# Patient Record
Sex: Male | Born: 1995 | Race: White | Hispanic: No | Marital: Single | Smoking: Never smoker
Health system: Southern US, Community
[De-identification: ages and names within clinical notes are randomized; demographics above are authoritative.]

## PROBLEM LIST (undated history)

## (undated) DIAGNOSIS — F32A Depression, unspecified: Secondary | ICD-10-CM

## (undated) DIAGNOSIS — N189 Chronic kidney disease, unspecified: Secondary | ICD-10-CM

## (undated) DIAGNOSIS — F419 Anxiety disorder, unspecified: Secondary | ICD-10-CM

## (undated) DIAGNOSIS — E119 Type 2 diabetes mellitus without complications: Secondary | ICD-10-CM

## (undated) DIAGNOSIS — N184 Chronic kidney disease, stage 4 (severe): Secondary | ICD-10-CM

---
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
## 2018-06-14 DIAGNOSIS — E1065 Type 1 diabetes mellitus with hyperglycemia: Secondary | ICD-10-CM | POA: Diagnosis not present

## 2018-07-25 DIAGNOSIS — Z6820 Body mass index (BMI) 20.0-20.9, adult: Secondary | ICD-10-CM | POA: Diagnosis not present

## 2018-07-25 DIAGNOSIS — E109 Type 1 diabetes mellitus without complications: Secondary | ICD-10-CM | POA: Diagnosis not present

## 2018-08-30 DIAGNOSIS — Z6820 Body mass index (BMI) 20.0-20.9, adult: Secondary | ICD-10-CM | POA: Diagnosis not present

## 2018-08-30 DIAGNOSIS — E109 Type 1 diabetes mellitus without complications: Secondary | ICD-10-CM | POA: Diagnosis not present

## 2018-09-09 DIAGNOSIS — E109 Type 1 diabetes mellitus without complications: Secondary | ICD-10-CM | POA: Diagnosis not present

## 2018-10-11 DIAGNOSIS — E109 Type 1 diabetes mellitus without complications: Secondary | ICD-10-CM | POA: Diagnosis not present

## 2018-10-11 DIAGNOSIS — Z6820 Body mass index (BMI) 20.0-20.9, adult: Secondary | ICD-10-CM | POA: Diagnosis not present

## 2018-11-08 DIAGNOSIS — Z6820 Body mass index (BMI) 20.0-20.9, adult: Secondary | ICD-10-CM | POA: Diagnosis not present

## 2018-11-08 DIAGNOSIS — E109 Type 1 diabetes mellitus without complications: Secondary | ICD-10-CM | POA: Diagnosis not present

## 2018-11-16 DIAGNOSIS — E109 Type 1 diabetes mellitus without complications: Secondary | ICD-10-CM | POA: Diagnosis not present

## 2018-12-20 DIAGNOSIS — E109 Type 1 diabetes mellitus without complications: Secondary | ICD-10-CM | POA: Diagnosis not present

## 2019-01-09 DIAGNOSIS — E109 Type 1 diabetes mellitus without complications: Secondary | ICD-10-CM | POA: Diagnosis not present

## 2019-01-09 DIAGNOSIS — Z6820 Body mass index (BMI) 20.0-20.9, adult: Secondary | ICD-10-CM | POA: Diagnosis not present

## 2019-02-20 DIAGNOSIS — E109 Type 1 diabetes mellitus without complications: Secondary | ICD-10-CM | POA: Diagnosis not present

## 2019-02-23 DIAGNOSIS — Z6820 Body mass index (BMI) 20.0-20.9, adult: Secondary | ICD-10-CM | POA: Diagnosis not present

## 2019-02-23 DIAGNOSIS — Z7189 Other specified counseling: Secondary | ICD-10-CM | POA: Diagnosis not present

## 2019-02-23 DIAGNOSIS — E109 Type 1 diabetes mellitus without complications: Secondary | ICD-10-CM | POA: Diagnosis not present

## 2019-02-27 DIAGNOSIS — E109 Type 1 diabetes mellitus without complications: Secondary | ICD-10-CM | POA: Diagnosis not present

## 2019-05-29 DIAGNOSIS — Z6820 Body mass index (BMI) 20.0-20.9, adult: Secondary | ICD-10-CM | POA: Diagnosis not present

## 2019-05-29 DIAGNOSIS — Z7189 Other specified counseling: Secondary | ICD-10-CM | POA: Diagnosis not present

## 2019-05-29 DIAGNOSIS — E109 Type 1 diabetes mellitus without complications: Secondary | ICD-10-CM | POA: Diagnosis not present

## 2019-05-29 DIAGNOSIS — R748 Abnormal levels of other serum enzymes: Secondary | ICD-10-CM | POA: Diagnosis not present

## 2019-08-28 DIAGNOSIS — Z7189 Other specified counseling: Secondary | ICD-10-CM | POA: Diagnosis not present

## 2019-08-28 DIAGNOSIS — R748 Abnormal levels of other serum enzymes: Secondary | ICD-10-CM | POA: Diagnosis not present

## 2019-08-28 DIAGNOSIS — E109 Type 1 diabetes mellitus without complications: Secondary | ICD-10-CM | POA: Diagnosis not present

## 2019-08-31 DIAGNOSIS — E109 Type 1 diabetes mellitus without complications: Secondary | ICD-10-CM | POA: Diagnosis not present

## 2019-11-27 DIAGNOSIS — E109 Type 1 diabetes mellitus without complications: Secondary | ICD-10-CM | POA: Diagnosis not present

## 2019-11-30 DIAGNOSIS — Z7189 Other specified counseling: Secondary | ICD-10-CM | POA: Diagnosis not present

## 2019-11-30 DIAGNOSIS — E109 Type 1 diabetes mellitus without complications: Secondary | ICD-10-CM | POA: Diagnosis not present

## 2019-11-30 DIAGNOSIS — R748 Abnormal levels of other serum enzymes: Secondary | ICD-10-CM | POA: Diagnosis not present

## 2019-12-19 DIAGNOSIS — Z23 Encounter for immunization: Secondary | ICD-10-CM | POA: Diagnosis not present

## 2020-01-16 DIAGNOSIS — Z23 Encounter for immunization: Secondary | ICD-10-CM | POA: Diagnosis not present

## 2020-01-31 DIAGNOSIS — E109 Type 1 diabetes mellitus without complications: Secondary | ICD-10-CM | POA: Diagnosis not present

## 2020-05-27 DIAGNOSIS — E109 Type 1 diabetes mellitus without complications: Secondary | ICD-10-CM | POA: Diagnosis not present

## 2020-06-03 DIAGNOSIS — E109 Type 1 diabetes mellitus without complications: Secondary | ICD-10-CM | POA: Diagnosis not present

## 2020-06-10 DIAGNOSIS — E109 Type 1 diabetes mellitus without complications: Secondary | ICD-10-CM | POA: Diagnosis not present

## 2020-06-15 DIAGNOSIS — E109 Type 1 diabetes mellitus without complications: Secondary | ICD-10-CM | POA: Diagnosis not present

## 2020-07-01 DIAGNOSIS — E161 Other hypoglycemia: Secondary | ICD-10-CM | POA: Diagnosis not present

## 2020-07-01 DIAGNOSIS — R52 Pain, unspecified: Secondary | ICD-10-CM | POA: Diagnosis not present

## 2020-07-01 DIAGNOSIS — R569 Unspecified convulsions: Secondary | ICD-10-CM | POA: Diagnosis not present

## 2020-07-01 DIAGNOSIS — E162 Hypoglycemia, unspecified: Secondary | ICD-10-CM | POA: Diagnosis not present

## 2020-08-22 DIAGNOSIS — E109 Type 1 diabetes mellitus without complications: Secondary | ICD-10-CM | POA: Diagnosis not present

## 2020-11-27 DIAGNOSIS — E109 Type 1 diabetes mellitus without complications: Secondary | ICD-10-CM | POA: Diagnosis not present

## 2020-12-02 DIAGNOSIS — E1069 Type 1 diabetes mellitus with other specified complication: Secondary | ICD-10-CM | POA: Diagnosis not present

## 2020-12-02 DIAGNOSIS — E559 Vitamin D deficiency, unspecified: Secondary | ICD-10-CM | POA: Diagnosis not present

## 2020-12-02 DIAGNOSIS — R809 Proteinuria, unspecified: Secondary | ICD-10-CM | POA: Diagnosis not present

## 2020-12-02 DIAGNOSIS — E109 Type 1 diabetes mellitus without complications: Secondary | ICD-10-CM | POA: Diagnosis not present

## 2020-12-03 DIAGNOSIS — E109 Type 1 diabetes mellitus without complications: Secondary | ICD-10-CM | POA: Diagnosis not present

## 2021-03-04 DIAGNOSIS — E1069 Type 1 diabetes mellitus with other specified complication: Secondary | ICD-10-CM | POA: Diagnosis not present

## 2021-03-11 DIAGNOSIS — E109 Type 1 diabetes mellitus without complications: Secondary | ICD-10-CM | POA: Diagnosis not present

## 2021-03-11 DIAGNOSIS — E1069 Type 1 diabetes mellitus with other specified complication: Secondary | ICD-10-CM | POA: Diagnosis not present

## 2021-03-11 DIAGNOSIS — E559 Vitamin D deficiency, unspecified: Secondary | ICD-10-CM | POA: Diagnosis not present

## 2021-03-11 DIAGNOSIS — R809 Proteinuria, unspecified: Secondary | ICD-10-CM | POA: Diagnosis not present

## 2021-03-24 DIAGNOSIS — E109 Type 1 diabetes mellitus without complications: Secondary | ICD-10-CM | POA: Diagnosis not present

## 2021-04-01 DIAGNOSIS — R809 Proteinuria, unspecified: Secondary | ICD-10-CM | POA: Diagnosis not present

## 2021-04-01 DIAGNOSIS — N2581 Secondary hyperparathyroidism of renal origin: Secondary | ICD-10-CM | POA: Diagnosis not present

## 2021-04-01 DIAGNOSIS — D631 Anemia in chronic kidney disease: Secondary | ICD-10-CM | POA: Diagnosis not present

## 2021-04-01 DIAGNOSIS — N181 Chronic kidney disease, stage 1: Secondary | ICD-10-CM | POA: Diagnosis not present

## 2021-04-02 ENCOUNTER — Other Ambulatory Visit: Payer: Self-pay | Admitting: Nephrology

## 2021-04-02 DIAGNOSIS — N181 Chronic kidney disease, stage 1: Secondary | ICD-10-CM

## 2021-04-15 ENCOUNTER — Ambulatory Visit
Admission: RE | Admit: 2021-04-15 | Discharge: 2021-04-15 | Disposition: A | Discharge status: Home / Self Care | Payer: Self-pay | Source: Ambulatory Visit | Attending: Nephrology | Admitting: Nephrology

## 2021-04-15 DIAGNOSIS — N181 Chronic kidney disease, stage 1: Secondary | ICD-10-CM

## 2021-04-15 DIAGNOSIS — R809 Proteinuria, unspecified: Secondary | ICD-10-CM | POA: Diagnosis not present

## 2021-06-03 DIAGNOSIS — R809 Proteinuria, unspecified: Secondary | ICD-10-CM | POA: Diagnosis not present

## 2021-06-03 DIAGNOSIS — N181 Chronic kidney disease, stage 1: Secondary | ICD-10-CM | POA: Diagnosis not present

## 2021-06-10 DIAGNOSIS — E1069 Type 1 diabetes mellitus with other specified complication: Secondary | ICD-10-CM | POA: Diagnosis not present

## 2021-06-10 DIAGNOSIS — E559 Vitamin D deficiency, unspecified: Secondary | ICD-10-CM | POA: Diagnosis not present

## 2021-06-10 DIAGNOSIS — E109 Type 1 diabetes mellitus without complications: Secondary | ICD-10-CM | POA: Diagnosis not present

## 2021-06-10 DIAGNOSIS — R809 Proteinuria, unspecified: Secondary | ICD-10-CM | POA: Diagnosis not present

## 2021-06-19 DIAGNOSIS — E109 Type 1 diabetes mellitus without complications: Secondary | ICD-10-CM | POA: Diagnosis not present

## 2021-10-07 DIAGNOSIS — E109 Type 1 diabetes mellitus without complications: Secondary | ICD-10-CM | POA: Diagnosis not present

## 2021-12-01 DIAGNOSIS — N181 Chronic kidney disease, stage 1: Secondary | ICD-10-CM | POA: Diagnosis not present

## 2021-12-08 DIAGNOSIS — N181 Chronic kidney disease, stage 1: Secondary | ICD-10-CM | POA: Diagnosis not present

## 2021-12-08 DIAGNOSIS — N2581 Secondary hyperparathyroidism of renal origin: Secondary | ICD-10-CM | POA: Diagnosis not present

## 2021-12-08 DIAGNOSIS — R809 Proteinuria, unspecified: Secondary | ICD-10-CM | POA: Diagnosis not present

## 2021-12-08 DIAGNOSIS — D631 Anemia in chronic kidney disease: Secondary | ICD-10-CM | POA: Diagnosis not present

## 2021-12-09 DIAGNOSIS — E1069 Type 1 diabetes mellitus with other specified complication: Secondary | ICD-10-CM | POA: Diagnosis not present

## 2021-12-09 DIAGNOSIS — E559 Vitamin D deficiency, unspecified: Secondary | ICD-10-CM | POA: Diagnosis not present

## 2021-12-16 DIAGNOSIS — E559 Vitamin D deficiency, unspecified: Secondary | ICD-10-CM | POA: Diagnosis not present

## 2021-12-16 DIAGNOSIS — R809 Proteinuria, unspecified: Secondary | ICD-10-CM | POA: Diagnosis not present

## 2021-12-16 DIAGNOSIS — E109 Type 1 diabetes mellitus without complications: Secondary | ICD-10-CM | POA: Diagnosis not present

## 2021-12-16 DIAGNOSIS — E1069 Type 1 diabetes mellitus with other specified complication: Secondary | ICD-10-CM | POA: Diagnosis not present

## 2022-02-06 DIAGNOSIS — E109 Type 1 diabetes mellitus without complications: Secondary | ICD-10-CM | POA: Diagnosis not present

## 2022-03-19 ENCOUNTER — Other Ambulatory Visit (HOSPITAL_COMMUNITY): Payer: Self-pay | Admitting: Nephrology

## 2022-03-19 ENCOUNTER — Other Ambulatory Visit: Payer: Self-pay | Admitting: Nephrology

## 2022-03-19 DIAGNOSIS — R809 Proteinuria, unspecified: Secondary | ICD-10-CM

## 2022-05-06 ENCOUNTER — Other Ambulatory Visit (HOSPITAL_COMMUNITY): Payer: Self-pay | Admitting: Physician Assistant

## 2022-05-06 ENCOUNTER — Other Ambulatory Visit: Payer: Self-pay | Admitting: Radiology

## 2022-05-06 DIAGNOSIS — Z01812 Encounter for preprocedural laboratory examination: Secondary | ICD-10-CM

## 2022-05-06 NOTE — H&P (Signed)
Chief Complaint: Patient was seen in consultation today for proteinuria  Referring Physician(s): Singh,Vikas  Supervising Physician: Gilmer Mor  Patient Status: [facility] - Out-pt  History of Present Illness: Stephen Prince is a 26 y.o. 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 He presents today in his usual state of health. He has been NPO.  He has turned off his insulin pump for hte procedure.  Denies new complaints or concerns. He is understanding of the goals of th eprocedure and is agreeable to proceed.   Past Medical History:  Diagnosis Date   Chronic kidney disease    Diabetes mellitus without complication (HCC)     Allergies: Patient has no known allergies.  Medications: Prior to Admission medications   Medication Sig Start Date End Date Taking? Authorizing Provider  insulin aspart (NOVOLOG) 100 UNIT/ML injection Inject into the skin continuous. Uses in insulin pump   Yes [provider]  losartan (COZAAR) 25 MG tablet Take 12.5 mg by mouth at bedtime.   Yes [provider]  rosuvastatin (CRESTOR) 10 MG tablet Take 10 mg by mouth daily.   Yes [provider]  sertraline (ZOLOFT) 50 MG tablet Take 50 mg by mouth daily.   Yes [provider]  Vitamin D, Ergocalciferol, (DRISDOL) 1.25 MG (50000 UNIT) CAPS capsule Take 50,000 Units by mouth every Wednesday.   Yes [provider]     History reviewed. No pertinent family history.  Social History   Socioeconomic History   Marital status: Single    Spouse name: Not on file   Number of children: Not on file   Years of education: Not on file   Highest education level: Not on file  Occupational History   Not on file  Tobacco Use   Smoking status: Never   Smokeless tobacco: Never  Vaping Use   Vaping Use: Never used  Substance and Sexual Activity   Alcohol use: Never    Drug use: Never   Sexual activity: Not on file  Other Topics Concern   Not on file  Social History Narrative   Not on file   Social Determinants of Health   Financial Resource Strain: Not on file  Food Insecurity: Not on file  Transportation Needs: Not on file  Physical Activity: Not on file  Stress: Not on file  Social Connections: Not on file     Review of Systems: A 12 point ROS discussed and pertinent positives are indicated in the HPI above.  All other systems are negative.  Review of Systems  Constitutional:  Negative for fatigue and fever.  Respiratory:  Negative for cough and shortness of breath.   Cardiovascular:  Negative for chest pain.  Gastrointestinal:  Negative for abdominal pain.  Musculoskeletal:  Negative for back pain.  Psychiatric/Behavioral:  Negative for behavioral problems and confusion.     Vital Signs: BP (!) 142/96   Pulse 100   Temp 98.5 F (36.9 C) (Oral)   Resp 16   Ht 5\' 7"  (1.702 m)   Wt 140 lb (63.5 kg)   SpO2 100%   BMI 21.93 kg/m   Physical Exam Vitals and nursing note reviewed.  Constitutional:      General: He is not in acute distress.    Appearance: Normal appearance. He is not ill-appearing.  HENT:     Mouth/Throat:     Mouth: Mucous membranes are moist.  Pharynx: Oropharynx is clear.  Cardiovascular:     Rate and Rhythm: Normal rate and regular rhythm.  Pulmonary:     Effort: Pulmonary effort is normal. No respiratory distress.     Breath sounds: Normal breath sounds.  Abdominal:     General: Abdomen is flat.     Palpations: Abdomen is soft.  Neurological:     General: No focal deficit present.     Mental Status: He is alert and oriented to person, place, and time. Mental status is at baseline.  Psychiatric:        Mood and Affect: Mood normal.        Behavior: Behavior normal.      MD Evaluation Airway: WNL Heart: WNL Abdomen: WNL Chest/ Lungs: WNL ASA  Classification: 3 Mallampati/Airway Score:  Two   Imaging: No results found.  Labs:  CBC: Recent Labs    05/07/22 0630  WBC 9.7  HGB 13.7  HCT 41.2  PLT 342    COAGS: Recent Labs    05/07/22 0630  INR 1.1    BMP: No results for input(s): "NA", "K", "CL", "CO2", "GLUCOSE", "BUN", "CALCIUM", "CREATININE", "GFRNONAA", "GFRAA" in the last 8760 hours.  Invalid input(s): "CMP"  LIVER FUNCTION TESTS: No results for input(s): "BILITOT", "AST", "ALT", "ALKPHOS", "PROT", "ALBUMIN" in the last 8760 hours.  TUMOR MARKERS: No results for input(s): "AFPTM", "CEA", "CA199", "CHROMGRNA" in the last 8760 hours.  Assessment and Plan: Patient with past medical history of DM1 presents with complaint of proteinuria.  IR consulted for random renal biospy at the request of Dr. Thedore Mins. Case reviewed by Dr. Jenkins Rouge who approves patient for procedure.  Patient presents today in their usual state of health.  He has been NPO and is not currently on blood thinners.   Risks and benefits was discussed with the patient and/or patient's family including, but not limited to bleeding, infection, damage to adjacent structures or low yield requiring additional tests.  All of the questions were answered and there is agreement to proceed.  Consent signed and in chart.   Thank you for this interesting consult.  I greatly enjoyed meeting ZADE FALKNER and look forward to participating in their care.  A copy of this report was sent to the requesting provider on this date.  Electronically Signed: Hoyt Koch, PA 05/07/2022, 7:58 AM   I spent a total of  30 Minutes   in face to face in clinical consultation, greater than 50% of which was counseling/coordinating care for proteinuria.

## 2022-05-07 ENCOUNTER — Encounter (HOSPITAL_COMMUNITY): Payer: Self-pay

## 2022-05-07 ENCOUNTER — Ambulatory Visit (HOSPITAL_COMMUNITY)
Admission: RE | Admit: 2022-05-07 | Discharge: 2022-05-07 | Disposition: A | Discharge status: Home / Self Care | Payer: 59 | Source: Ambulatory Visit | Attending: Nephrology | Admitting: Nephrology

## 2022-05-07 DIAGNOSIS — E1022 Type 1 diabetes mellitus with diabetic chronic kidney disease: Secondary | ICD-10-CM | POA: Insufficient documentation

## 2022-05-07 DIAGNOSIS — N189 Chronic kidney disease, unspecified: Secondary | ICD-10-CM | POA: Diagnosis not present

## 2022-05-07 DIAGNOSIS — E1069 Type 1 diabetes mellitus with other specified complication: Secondary | ICD-10-CM | POA: Diagnosis not present

## 2022-05-07 DIAGNOSIS — Z01812 Encounter for preprocedural laboratory examination: Secondary | ICD-10-CM

## 2022-05-07 DIAGNOSIS — R809 Proteinuria, unspecified: Secondary | ICD-10-CM | POA: Insufficient documentation

## 2022-05-07 HISTORY — DX: Type 2 diabetes mellitus without complications: E11.9

## 2022-05-07 HISTORY — DX: Chronic kidney disease, unspecified: N18.9

## 2022-05-07 LAB — CBC
HCT: 41.2 % (ref 39.0–52.0)
Hemoglobin: 13.7 g/dL (ref 13.0–17.0)
MCH: 28.4 pg (ref 26.0–34.0)
MCHC: 33.3 g/dL (ref 30.0–36.0)
MCV: 85.5 fL (ref 80.0–100.0)
Platelets: 342 10*3/uL (ref 150–400)
RBC: 4.82 MIL/uL (ref 4.22–5.81)
RDW: 12.2 % (ref 11.5–15.5)
WBC: 9.7 10*3/uL (ref 4.0–10.5)
nRBC: 0 % (ref 0.0–0.2)

## 2022-05-07 LAB — GLUCOSE, CAPILLARY: Glucose-Capillary: 85 mg/dL (ref 70–99)

## 2022-05-07 LAB — PROTIME-INR
INR: 1.1 (ref 0.8–1.2)
Prothrombin Time: 14.2 seconds (ref 11.4–15.2)

## 2022-05-07 MED ORDER — LIDOCAINE HCL (PF) 1 % IJ SOLN
INTRAMUSCULAR | Status: AC
  Filled 2022-05-07: qty 30

## 2022-05-07 MED ORDER — SODIUM CHLORIDE 0.9 % IV SOLN: INTRAVENOUS | Status: DC

## 2022-05-07 MED ORDER — FENTANYL CITRATE (PF) 100 MCG/2ML IJ SOLN
INTRAMUSCULAR | Status: AC | PRN
  Administered 2022-05-07: 25 ug via INTRAVENOUS
  Administered 2022-05-07: 50 ug via INTRAVENOUS

## 2022-05-07 MED ORDER — HYDRALAZINE HCL 20 MG/ML IJ SOLN
INTRAMUSCULAR | Status: AC
  Filled 2022-05-07: qty 1

## 2022-05-07 MED ORDER — MIDAZOLAM HCL 2 MG/2ML IJ SOLN
INTRAMUSCULAR | Status: AC | PRN
  Administered 2022-05-07: .5 mg via INTRAVENOUS
  Administered 2022-05-07: 1 mg via INTRAVENOUS

## 2022-05-07 MED ORDER — MIDAZOLAM HCL 2 MG/2ML IJ SOLN
INTRAMUSCULAR | Status: AC
  Filled 2022-05-07: qty 2

## 2022-05-07 MED ORDER — GELATIN ABSORBABLE 12-7 MM EX MISC
CUTANEOUS | Status: AC
  Filled 2022-05-07: qty 1

## 2022-05-07 MED ORDER — FENTANYL CITRATE (PF) 100 MCG/2ML IJ SOLN
INTRAMUSCULAR | Status: AC
  Filled 2022-05-07: qty 2

## 2022-05-07 MED ORDER — HYDRALAZINE HCL 20 MG/ML IJ SOLN
10.0000 mg | Freq: Once | INTRAMUSCULAR | Status: AC
Start: 2022-05-07 — End: 2022-05-07
  Administered 2022-05-07: 10 mg via INTRAVENOUS

## 2022-05-07 NOTE — Procedures (Signed)
Interventional Radiology Procedure Note  Procedure: US guided random renal biopsy, LEFT kidney  Complications: None  Estimated Blood Loss: None  Recommendations: - Bedrest x 4 hrs - Path sent   Signed,  Sterling Big, MD

## 2022-05-11 ENCOUNTER — Encounter (HOSPITAL_COMMUNITY): Payer: Self-pay

## 2022-05-12 LAB — SURGICAL PATHOLOGY

## 2022-10-08 ENCOUNTER — Ambulatory Visit: Payer: 59 | Admitting: Podiatry

## 2022-10-08 DIAGNOSIS — E1042 Type 1 diabetes mellitus with diabetic polyneuropathy: Secondary | ICD-10-CM | POA: Diagnosis not present

## 2022-10-08 DIAGNOSIS — L84 Corns and callosities: Secondary | ICD-10-CM | POA: Diagnosis not present

## 2022-10-08 NOTE — Progress Notes (Signed)
  Subjective:  Patient ID: Stephen Prince, male    DOB: Jun 25, 1996,  MRN: [id number]  Chief Complaint  Patient presents with   Diabetes    DFC/Evaluation  BS-177 A1C-6.8     27 y.o. male presents with the above complaint. History confirmed with patient. Patient presenting for diabetic foot evaluation and due to concern for calluses. Patient is unable to trim own nails related to nail dystrophy and/or mobility issues. Patient has a history of type 1 diabetes mellitus on insulin patient doest have callus present located at the distal tuft of the left hallux, right hallux interphalangeal joint medially causing pain.   Objective:  Physical Exam: warm, good capillary refill nail exam normal nails without lesions DP pulses palpable, PT pulses palpable, and protective sensation absent Left Foot: Hyperkeratotic lesion present medial aspect of the hallux IPJ Right Foot: Hyperkeratotic lesion mild skin fissuring at the distal aspect of the left hallux plantarly  Assessment:   1. Pre-ulcerative calluses   2. Type 1 diabetes mellitus with diabetic polyneuropathy ([place])      Plan:  Patient was evaluated and treated and all questions answered.  # Diabetes mellitus type 1 with early onset peripheral neuropathy Patient educated on diabetes. Discussed proper diabetic foot care and discussed risks and complications of disease. Educated patient in depth on reasons to return to the office immediately should he/she discover anything concerning or new on the feet. All questions answered. Discussed proper shoes as well.   #Hyperkeratotic lesions/pre ulcerative calluses present right hallux medial IPJ and left hallux plantar aspect All symptomatic hyperkeratoses x 2 separate lesions were safely debrided with a sterile #10 blade to patient's level of comfort without incident. We discussed preventative and palliative care of these lesions including supportive and accommodative shoegear, padding, prefabricated  and custom molded accommodative orthoses, use of a pumice stone and lotions/creams daily.  Return in about 6 months (around 04/08/2023) for Routine DM exam, calluses.         Everitt Amber, DPM [facility]

## 2023-02-26 DIAGNOSIS — I10 Essential (primary) hypertension: Secondary | ICD-10-CM | POA: Diagnosis present

## 2023-02-26 DIAGNOSIS — F32A Depression, unspecified: Secondary | ICD-10-CM | POA: Diagnosis present

## 2023-04-08 ENCOUNTER — Ambulatory Visit (INDEPENDENT_AMBULATORY_CARE_PROVIDER_SITE_OTHER): Payer: 59 | Admitting: Podiatry

## 2023-04-08 DIAGNOSIS — L84 Corns and callosities: Secondary | ICD-10-CM

## 2023-04-08 DIAGNOSIS — B351 Tinea unguium: Secondary | ICD-10-CM

## 2023-04-08 DIAGNOSIS — M79676 Pain in unspecified toe(s): Secondary | ICD-10-CM | POA: Diagnosis not present

## 2023-04-08 DIAGNOSIS — E1042 Type 1 diabetes mellitus with diabetic polyneuropathy: Secondary | ICD-10-CM

## 2023-04-08 NOTE — Progress Notes (Signed)
  Subjective:  Patient ID: Stephen Prince, male    DOB: September 07, 1996,  MRN: [id number]  Chief Complaint  Patient presents with   Nail Problem    Diabetic Foot Care-nail trim    Callouses    Callus trim     27 y.o. male presents with the above complaint. History confirmed with patient. Patient presenting for diabetic foot evaluation and due to concern for calluses. Patient is unable to trim own nails related to nail dystrophy and/or mobility issues. Patient has a history of type 1 diabetes mellitus on insulin patient doest have callus present located at the distal tuft of the left hallux, right hallux interphalangeal joint medially causing pain.   Objective:  Physical Exam: warm, good capillary refill nail exam normal nails without lesions DP pulses palpable, PT pulses palpable, and protective sensation absent Left Foot: Hyperkeratotic lesion present medial aspect of the hallux IPJ Right Foot: Hyperkeratotic lesion mild skin fissuring at the distal aspect of the left hallux plantarly  Assessment:   1. Pre-ulcerative calluses   2. Type 1 diabetes mellitus with diabetic polyneuropathy (HCC)       Plan:  Patient was evaluated and treated and all questions answered.   #Hyperkeratotic lesions/pre ulcerative calluses present right hallux medial IPJ and left hallux plantar aspect All symptomatic hyperkeratoses x 2 separate lesions were safely debrided with a sterile #10 blade to patient's level of comfort without incident. We discussed preventative and palliative care of these lesions including supportive and accommodative shoegear, padding, prefabricated and custom molded accommodative orthoses, use of a pumice stone and lotions/creams daily.  Return in about 3 months (around 07/09/2023).         Corinna Gab, DPM [facility]

## 2023-05-11 ENCOUNTER — Emergency Department (HOSPITAL_COMMUNITY): Payer: 59

## 2023-05-11 ENCOUNTER — Inpatient Hospital Stay (HOSPITAL_COMMUNITY)
Admission: EM | Admit: 2023-05-11 | Discharge: 2023-05-14 | DRG: 638 | Disposition: A | Discharge status: Home / Self Care | Payer: 59 | Attending: Internal Medicine | Admitting: Internal Medicine

## 2023-05-11 ENCOUNTER — Encounter (HOSPITAL_COMMUNITY): Payer: Self-pay

## 2023-05-11 ENCOUNTER — Other Ambulatory Visit: Payer: Self-pay

## 2023-05-11 DIAGNOSIS — E1022 Type 1 diabetes mellitus with diabetic chronic kidney disease: Secondary | ICD-10-CM | POA: Diagnosis present

## 2023-05-11 DIAGNOSIS — R569 Unspecified convulsions: Secondary | ICD-10-CM | POA: Diagnosis not present

## 2023-05-11 DIAGNOSIS — E860 Dehydration: Secondary | ICD-10-CM | POA: Diagnosis present

## 2023-05-11 DIAGNOSIS — N184 Chronic kidney disease, stage 4 (severe): Secondary | ICD-10-CM | POA: Diagnosis present

## 2023-05-11 DIAGNOSIS — E872 Acidosis, unspecified: Secondary | ICD-10-CM | POA: Diagnosis present

## 2023-05-11 DIAGNOSIS — E785 Hyperlipidemia, unspecified: Secondary | ICD-10-CM | POA: Diagnosis present

## 2023-05-11 DIAGNOSIS — E10649 Type 1 diabetes mellitus with hypoglycemia without coma: Secondary | ICD-10-CM | POA: Diagnosis not present

## 2023-05-11 DIAGNOSIS — N179 Acute kidney failure, unspecified: Secondary | ICD-10-CM | POA: Diagnosis present

## 2023-05-11 DIAGNOSIS — Z794 Long term (current) use of insulin: Secondary | ICD-10-CM

## 2023-05-11 DIAGNOSIS — E871 Hypo-osmolality and hyponatremia: Secondary | ICD-10-CM | POA: Diagnosis present

## 2023-05-11 DIAGNOSIS — E1065 Type 1 diabetes mellitus with hyperglycemia: Secondary | ICD-10-CM | POA: Diagnosis not present

## 2023-05-11 DIAGNOSIS — Z9641 Presence of insulin pump (external) (internal): Secondary | ICD-10-CM | POA: Diagnosis present

## 2023-05-11 DIAGNOSIS — Z79899 Other long term (current) drug therapy: Secondary | ICD-10-CM

## 2023-05-11 DIAGNOSIS — E11649 Type 2 diabetes mellitus with hypoglycemia without coma: Secondary | ICD-10-CM | POA: Diagnosis present

## 2023-05-11 DIAGNOSIS — E8881 Metabolic syndrome: Secondary | ICD-10-CM | POA: Diagnosis present

## 2023-05-11 LAB — CBC WITH DIFFERENTIAL/PLATELET
Abs Immature Granulocytes: 0.07 10*3/uL (ref 0.00–0.07)
Basophils Absolute: 0 10*3/uL (ref 0.0–0.1)
Basophils Relative: 0 %
Eosinophils Absolute: 0.1 10*3/uL (ref 0.0–0.5)
Eosinophils Relative: 1 %
HCT: 34.6 % — ABNORMAL LOW (ref 39.0–52.0)
Hemoglobin: 11.3 g/dL — ABNORMAL LOW (ref 13.0–17.0)
Immature Granulocytes: 1 %
Lymphocytes Relative: 12 %
Lymphs Abs: 1.3 10*3/uL (ref 0.7–4.0)
MCH: 27 pg (ref 26.0–34.0)
MCHC: 32.7 g/dL (ref 30.0–36.0)
MCV: 82.8 fL (ref 80.0–100.0)
Monocytes Absolute: 0.8 10*3/uL (ref 0.1–1.0)
Monocytes Relative: 8 %
Neutro Abs: 8.5 10*3/uL — ABNORMAL HIGH (ref 1.7–7.7)
Neutrophils Relative %: 78 %
Platelets: 293 10*3/uL (ref 150–400)
RBC: 4.18 MIL/uL — ABNORMAL LOW (ref 4.22–5.81)
RDW: 12.9 % (ref 11.5–15.5)
WBC: 10.8 10*3/uL — ABNORMAL HIGH (ref 4.0–10.5)
nRBC: 0 % (ref 0.0–0.2)

## 2023-05-11 LAB — COMPREHENSIVE METABOLIC PANEL
ALT: 19 U/L (ref 0–44)
AST: 25 U/L (ref 15–41)
Albumin: 2.5 g/dL — ABNORMAL LOW (ref 3.5–5.0)
Alkaline Phosphatase: 65 U/L (ref 38–126)
Anion gap: 10 (ref 5–15)
BUN: 38 mg/dL — ABNORMAL HIGH (ref 6–20)
CO2: 20 mmol/L — ABNORMAL LOW (ref 22–32)
Calcium: 8.2 mg/dL — ABNORMAL LOW (ref 8.9–10.3)
Chloride: 104 mmol/L (ref 98–111)
Creatinine, Ser: 3.38 mg/dL — ABNORMAL HIGH (ref 0.61–1.24)
GFR, Estimated: 25 mL/min — ABNORMAL LOW (ref >60)
Glucose, Bld: 190 mg/dL — ABNORMAL HIGH (ref 70–99)
Potassium: 4.3 mmol/L (ref 3.5–5.1)
Sodium: 134 mmol/L — ABNORMAL LOW (ref 135–145)
Total Bilirubin: 0.4 mg/dL (ref 0.3–1.2)
Total Protein: 5.5 g/dL — ABNORMAL LOW (ref 6.5–8.1)

## 2023-05-11 LAB — I-STAT CG4 LACTIC ACID, ED: Lactic Acid, Venous: 1.6 mmol/L (ref 0.5–1.9)

## 2023-05-11 LAB — CBG MONITORING, ED: Glucose-Capillary: 175 mg/dL — ABNORMAL HIGH (ref 70–99)

## 2023-05-11 LAB — ETHANOL: Alcohol, Ethyl (B): <10 mg/dL (ref <10)

## 2023-05-11 MED ORDER — SODIUM CHLORIDE 0.9 % IV BOLUS
1000.0000 mL | Freq: Once | INTRAVENOUS | Status: AC
  Administered 2023-05-11: 1000 mL via INTRAVENOUS
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
## 2023-05-11 NOTE — ED Notes (Signed)
Patient transported to CT 

## 2023-05-11 NOTE — ED Provider Notes (Signed)
[place] EMERGENCY DEPARTMENT AT [facility] Provider Note  CSN: [id number] Arrival date & time: 05/11/23 2011  Chief Complaint(s) Seizures  HPI Stephen Prince is a 27 y.o. male history of type 1 diabetes presenting to the emergency department with possible seizure.  Patient's family reports that the patient came home from work, went to a different room and then they heard a crash, went in and saw patient looking stiff and then developed generalized shaking activity.  Patient stopped, on EMS arrival, patient was combative and was given Versed.  Apparently his insulin pump/glucose monitor had said his blood sugar was 40 and actually gave him another dose of insulin.  When paramedics came patient's blood sugar on their check was 110 but gave additional D10.  Family report that he has had 2 previous episodes, both also in the setting of apparent pump malfunction, they have discussed this with his endocrinologist apparently was aware.  He has not sought care for either prior episode.   Past Medical History Past Medical History:  Diagnosis Date   Chronic kidney disease    Diabetes mellitus without complication (HCC)    There are no problems to display for this patient.  Home Medication(s) Prior to Admission medications   Medication Sig Start Date End Date Taking? Authorizing Provider  doxycycline (VIBRAMYCIN) 100 MG capsule Take 100 mg by mouth 2 (two) times daily. 03/31/23  Yes [provider]  insulin aspart (NOVOLOG) 100 UNIT/ML injection Inject into the skin continuous. Uses in insulin pump   Yes [provider]  losartan (COZAAR) 100 MG tablet Take 50 mg by mouth daily.   Yes [provider]  rosuvastatin (CRESTOR) 20 MG tablet Take 20 mg by mouth at bedtime.   Yes [provider]  sertraline (ZOLOFT) 100 MG tablet Take 100 mg by mouth at bedtime.   Yes [provider]  Vitamin D, Ergocalciferol, (DRISDOL) 1.25 MG (50000 UNIT) CAPS  capsule Take 50,000 Units by mouth every 7 (seven) days.   Yes [provider]                                                                                                                                    Past Surgical History History reviewed. No pertinent surgical history. Family History History reviewed. No pertinent family history.  Social History Social History   Tobacco Use   Smoking status: Never   Smokeless tobacco: Never  Vaping Use   Vaping status: Never Used  Substance Use Topics   Alcohol use: Never   Drug use: Never   Allergies Patient has no known allergies.  Review of Systems Review of Systems  Unable to perform ROS: Mental status change    Physical Exam Vital Signs  I have reviewed the triage vital signs BP (!) 142/97   Pulse 95   Temp (!) 96.8 F (36 C) (Axillary)   Resp (!) 24  SpO2 97%  Physical Exam Vitals and nursing note reviewed.  Constitutional:      General: He is not in acute distress.    Comments: Somnolent, arouses to sternal rub but falls back asleep  HENT:     Mouth/Throat:     Mouth: Mucous membranes are moist.     Comments: Small laceration to tongue Eyes:     Conjunctiva/sclera: Conjunctivae normal.  Cardiovascular:     Rate and Rhythm: Normal rate and regular rhythm.  Pulmonary:     Effort: Pulmonary effort is normal. No respiratory distress.     Breath sounds: Normal breath sounds.  Abdominal:     General: Abdomen is flat.     Palpations: Abdomen is soft.     Tenderness: There is no abdominal tenderness.  Musculoskeletal:     Right lower leg: No edema.     Left lower leg: No edema.  Skin:    General: Skin is warm and dry.     Capillary Refill: Capillary refill takes less than 2 seconds.  Neurological:     Comments: Somnolent, moves all 4 extremities, no facial droop, not following commands  Psychiatric:        Mood and Affect: Mood normal.        Behavior: Behavior normal.     ED Results and  Treatments Labs (all labs ordered are listed, but only abnormal results are displayed) Labs Reviewed  COMPREHENSIVE METABOLIC PANEL - Abnormal; Notable for the following components:      Result Value   Sodium 134 (*)    CO2 20 (*)    Glucose, Bld 190 (*)    BUN 38 (*)    Creatinine, Ser 3.38 (*)    Calcium 8.2 (*)    Total Protein 5.5 (*)    Albumin 2.5 (*)    GFR, Estimated 25 (*)    All other components within normal limits  CBC WITH DIFFERENTIAL/PLATELET - Abnormal; Notable for the following components:   WBC 10.8 (*)    RBC 4.18 (*)    Hemoglobin 11.3 (*)    HCT 34.6 (*)    Neutro Abs 8.5 (*)    All other components within normal limits  CBG MONITORING, ED - Abnormal; Notable for the following components:   Glucose-Capillary 175 (*)    All other components within normal limits  ETHANOL  RAPID URINE DRUG SCREEN, HOSP PERFORMED  URINALYSIS, W/ REFLEX TO CULTURE (INFECTION SUSPECTED)  I-STAT CG4 LACTIC ACID, ED  I-STAT CG4 LACTIC ACID, ED                                                                                                                          Radiology CT Head Wo Contrast  Result Date: 05/11/2023 CLINICAL DATA:  Seizure EXAM: CT HEAD WITHOUT CONTRAST TECHNIQUE: Contiguous axial images were obtained from the base of the skull through the vertex without intravenous contrast. RADIATION DOSE REDUCTION: This exam was performed according to the departmental  dose-optimization program which includes automated exposure control, adjustment of the mA and/or kV according to patient size and/or use of iterative reconstruction technique. COMPARISON:  None Available. FINDINGS: Brain: There is no mass, hemorrhage or extra-axial collection. The size and configuration of the ventricles and extra-axial CSF spaces are normal. The brain parenchyma is normal, without acute or chronic infarction. Vascular: No abnormal hyperdensity of the major intracranial arteries or dural venous  sinuses. No intracranial atherosclerosis. Skull: The visualized skull base, calvarium and extracranial soft tissues are normal. Sinuses/Orbits: No fluid levels or advanced mucosal thickening of the visualized paranasal sinuses. No mastoid or middle ear effusion. The orbits are normal. IMPRESSION: Normal head CT. Electronically Signed   By: Deatra Robinson M.D.   On: 05/11/2023 22:07    Pertinent labs & imaging results that were available during my care of the patient were reviewed by me and considered in my medical decision making (see MDM for details).  Medications Ordered in ED Medications  sodium chloride 0.9 % bolus 1,000 mL (0 mLs Intravenous Stopped 05/11/23 2222)                                                                                                                                     Procedures Procedures  (including critical care time)  Medical Decision Making / ED Course   MDM:  27 year old presenting to the emergency department with possible seizure.  Seems most likely patient did have seizure, did have witnessed possible seizure activity, tongue biting, was apparently combative afterwards which is very far off of his baseline per parents.  Also seems less likely because of his low blood sugar, possibly due to insulin pump malfunction.  On EMS check it was 110 and has been normal here.  Will obtain testing to evaluate for seizure cause such as laboratory tests including  urine drug screen, CMP, urinalysis, CT head.  Will continue to observe.  If patient returns to baseline, blood sugar stable, no other cause of seizure identified, likely discharge, may need to stop insulin pump for now until following up with endocrinology.  Clinical Course as of 05/11/23 2333  Tue May 11, 2023  2325 Labs notable for creatinine of 3.38.  Patient follows up outpatient with nephrology, consulted Dr. Juel Burrow, but he is unable to look up labs outside the hospital.  Patient's father has most recent  labs as far as he is aware on his phone which shows creatinine of 2.  Possible that his insulin pump is overdosing him accounting for his worsening renal function.  Insulin pump has been disconnected.  Discussed also with Dr. Amada Jupiter with neurology, agrees with admission, recommends EEG, likely all due to his hypoglycemic events.  Advised patient and family that he should not drive for 6 months until seizure-free. [WS]  2331 Discussed with the hospitalist Dr. Emmit Pomfret who admit the patient. [WS]    Clinical Course User Index [WS] Alvino Blood  L, MD     Additional history obtained: -Additional history obtained from family and ems -External records from outside source obtained and reviewed including: Chart review including previous notes, labs, imaging, consultation notes including prior PMD notes   Lab Tests: -I ordered, reviewed, and interpreted labs.   The pertinent results include:   Labs Reviewed  COMPREHENSIVE METABOLIC PANEL - Abnormal; Notable for the following components:      Result Value   Sodium 134 (*)    CO2 20 (*)    Glucose, Bld 190 (*)    BUN 38 (*)    Creatinine, Ser 3.38 (*)    Calcium 8.2 (*)    Total Protein 5.5 (*)    Albumin 2.5 (*)    GFR, Estimated 25 (*)    All other components within normal limits  CBC WITH DIFFERENTIAL/PLATELET - Abnormal; Notable for the following components:   WBC 10.8 (*)    RBC 4.18 (*)    Hemoglobin 11.3 (*)    HCT 34.6 (*)    Neutro Abs 8.5 (*)    All other components within normal limits  CBG MONITORING, ED - Abnormal; Notable for the following components:   Glucose-Capillary 175 (*)    All other components within normal limits  ETHANOL  RAPID URINE DRUG SCREEN, HOSP PERFORMED  URINALYSIS, W/ REFLEX TO CULTURE (INFECTION SUSPECTED)  I-STAT CG4 LACTIC ACID, ED  I-STAT CG4 LACTIC ACID, ED    Notable for elevated creatinine, mild low CO2, mild leukocytosis, reassuring CBG  EKG   EKG  Interpretation Date/Time:  Tuesday May 11 2023 20:23:58 EDT Ventricular Rate:  94 PR Interval:  140 QRS Duration:  78 QT Interval:  362 QTC Calculation: 453 R Axis:   72  Text Interpretation: Sinus rhythm Probable left atrial enlargement Borderline T wave abnormalities Confirmed by Alvino Blood ([id number]) on 05/11/2023 8:57:24 PM         Imaging Studies ordered: I ordered imaging studies including CT head On my interpretation imaging demonstrates no acute process I independently visualized and interpreted imaging. I agree with the radiologist interpretation   Medicines ordered and prescription drug management: Meds ordered this encounter  Medications   sodium chloride 0.9 % bolus 1,000 mL    -I have reviewed the patients home medicines and have made adjustments as needed   Consultations Obtained: I requested consultation with the neurologist,  and discussed lab and imaging findings as well as pertinent plan - they recommend: EEG   Cardiac Monitoring: The patient was maintained on a cardiac monitor.  I personally viewed and interpreted the cardiac monitored which showed an underlying rhythm of: NSR   Reevaluation: After the interventions noted above, I reevaluated the patient and found that their symptoms have improved  Co morbidities that complicate the patient evaluation  Past Medical History:  Diagnosis Date   Chronic kidney disease    Diabetes mellitus without complication (HCC)       Dispostion: Disposition decision including need for hospitalization was considered, and patient admitted to the hospital.    Final Clinical Impression(s) / ED Diagnoses Final diagnoses:  Seizure (HCC)  Acute kidney injury superimposed on CKD [facility])     This chart was dictated using voice recognition software.  Despite best efforts to proofread,  errors can occur which can change the documentation meaning.    Lonell Grandchild, MD 05/11/23 [phone]

## 2023-05-12 ENCOUNTER — Observation Stay (HOSPITAL_COMMUNITY): Payer: 59

## 2023-05-12 ENCOUNTER — Other Ambulatory Visit: Payer: Self-pay

## 2023-05-12 DIAGNOSIS — R569 Unspecified convulsions: Secondary | ICD-10-CM | POA: Diagnosis not present

## 2023-05-12 DIAGNOSIS — E11649 Type 2 diabetes mellitus with hypoglycemia without coma: Secondary | ICD-10-CM | POA: Diagnosis present

## 2023-05-12 LAB — CBC
HCT: 35.2 % — ABNORMAL LOW (ref 39.0–52.0)
HCT: 32.4 % — ABNORMAL LOW (ref 39.0–52.0)
Hemoglobin: 11.5 g/dL — ABNORMAL LOW (ref 13.0–17.0)
Hemoglobin: 10.6 g/dL — ABNORMAL LOW (ref 13.0–17.0)
MCH: 27.2 pg (ref 26.0–34.0)
MCH: 27.2 pg (ref 26.0–34.0)
MCHC: 32.7 g/dL (ref 30.0–36.0)
MCHC: 32.7 g/dL (ref 30.0–36.0)
MCV: 83.2 fL (ref 80.0–100.0)
MCV: 83.1 fL (ref 80.0–100.0)
Platelets: 272 10*3/uL (ref 150–400)
Platelets: 296 10*3/uL (ref 150–400)
RBC: 4.23 MIL/uL (ref 4.22–5.81)
RBC: 3.9 MIL/uL — ABNORMAL LOW (ref 4.22–5.81)
RDW: 13.1 % (ref 11.5–15.5)
RDW: 13.1 % (ref 11.5–15.5)
WBC: 20.3 10*3/uL — ABNORMAL HIGH (ref 4.0–10.5)
WBC: 18.1 10*3/uL — ABNORMAL HIGH (ref 4.0–10.5)
nRBC: 0 % (ref 0.0–0.2)
nRBC: 0 % (ref 0.0–0.2)

## 2023-05-12 LAB — CREATININE, SERUM
Creatinine, Ser: 3.97 mg/dL — ABNORMAL HIGH (ref 0.61–1.24)
GFR, Estimated: 20 mL/min — ABNORMAL LOW (ref >60)

## 2023-05-12 LAB — HEMOGLOBIN A1C
Hgb A1c MFr Bld: 7.2 % — ABNORMAL HIGH (ref 4.8–5.6)
Hgb A1c MFr Bld: 7.4 % — ABNORMAL HIGH (ref 4.8–5.6)
Mean Plasma Glucose: 159.94 mg/dL
Mean Plasma Glucose: 165.68 mg/dL

## 2023-05-12 LAB — BASIC METABOLIC PANEL
Anion gap: 13 (ref 5–15)
BUN: 46 mg/dL — ABNORMAL HIGH (ref 6–20)
CO2: 16 mmol/L — ABNORMAL LOW (ref 22–32)
Calcium: 8.4 mg/dL — ABNORMAL LOW (ref 8.9–10.3)
Chloride: 104 mmol/L (ref 98–111)
Creatinine, Ser: 3.47 mg/dL — ABNORMAL HIGH (ref 0.61–1.24)
GFR, Estimated: 24 mL/min — ABNORMAL LOW (ref >60)
Glucose, Bld: 492 mg/dL — ABNORMAL HIGH (ref 70–99)
Potassium: 4.8 mmol/L (ref 3.5–5.1)
Sodium: 133 mmol/L — ABNORMAL LOW (ref 135–145)

## 2023-05-12 LAB — GLUCOSE, CAPILLARY
Glucose-Capillary: 330 mg/dL — ABNORMAL HIGH (ref 70–99)
Glucose-Capillary: 340 mg/dL — ABNORMAL HIGH (ref 70–99)
Glucose-Capillary: 266 mg/dL — ABNORMAL HIGH (ref 70–99)
Glucose-Capillary: 235 mg/dL — ABNORMAL HIGH (ref 70–99)

## 2023-05-12 LAB — HIV ANTIBODY (ROUTINE TESTING W REFLEX): HIV Screen 4th Generation wRfx: NONREACTIVE

## 2023-05-12 LAB — CBG MONITORING, ED: Glucose-Capillary: 443 mg/dL — ABNORMAL HIGH (ref 70–99)

## 2023-05-12 MED ORDER — INSULIN GLARGINE-YFGN 100 UNIT/ML ~~LOC~~ SOLN
16.0000 [IU] | Freq: Every day | SUBCUTANEOUS | Status: DC
  Administered 2023-05-12: 16 [IU] via SUBCUTANEOUS
  Filled 2023-05-12 (×2): qty 0.16

## 2023-05-12 MED ORDER — INSULIN ASPART 100 UNIT/ML IJ SOLN
0.0000 [IU] | INTRAMUSCULAR | Status: DC
  Administered 2023-05-12: 6 [IU] via SUBCUTANEOUS
  Administered 2023-05-12: 4 [IU] via SUBCUTANEOUS

## 2023-05-12 MED ORDER — ENOXAPARIN SODIUM 40 MG/0.4ML IJ SOSY
40.0000 mg | PREFILLED_SYRINGE | INTRAMUSCULAR | Status: DC
  Administered 2023-05-12: 40 mg via SUBCUTANEOUS
  Filled 2023-05-12: qty 0.4

## 2023-05-12 MED ORDER — INSULIN ASPART 100 UNIT/ML IJ SOLN: 0.0000 [IU] | Freq: Three times a day (TID) | INTRAMUSCULAR | Status: DC

## 2023-05-12 MED ORDER — ENOXAPARIN SODIUM 30 MG/0.3ML IJ SOSY
30.0000 mg | PREFILLED_SYRINGE | INTRAMUSCULAR | Status: DC
  Administered 2023-05-13: 30 mg via SUBCUTANEOUS
  Filled 2023-05-12: qty 0.3

## 2023-05-12 MED ORDER — ROSUVASTATIN CALCIUM 20 MG PO TABS
20.0000 mg | ORAL_TABLET | Freq: Every day | ORAL | Status: DC
  Administered 2023-05-12 – 2023-05-13 (×2): 20 mg via ORAL
  Filled 2023-05-12 (×2): qty 1

## 2023-05-12 MED ORDER — INSULIN ASPART 100 UNIT/ML IJ SOLN
0.0000 [IU] | Freq: Three times a day (TID) | INTRAMUSCULAR | Status: DC
  Administered 2023-05-12: 3 [IU] via SUBCUTANEOUS
  Administered 2023-05-12: 4 [IU] via SUBCUTANEOUS
  Administered 2023-05-13: 3 [IU] via SUBCUTANEOUS

## 2023-05-12 MED ORDER — INSULIN ASPART 100 UNIT/ML IJ SOLN
0.0000 [IU] | Freq: Every day | INTRAMUSCULAR | Status: DC
  Administered 2023-05-12: 2 [IU] via SUBCUTANEOUS

## 2023-05-12 MED ORDER — INSULIN ASPART 100 UNIT/ML IJ SOLN: 0.0000 [IU] | Freq: Every day | INTRAMUSCULAR | Status: DC

## 2023-05-12 MED ORDER — LOSARTAN POTASSIUM 50 MG PO TABS
25.0000 mg | ORAL_TABLET | Freq: Every day | ORAL | Status: DC
  Filled 2023-05-12: qty 1

## 2023-05-12 MED ORDER — INSULIN ASPART 100 UNIT/ML IJ SOLN
3.0000 [IU] | Freq: Three times a day (TID) | INTRAMUSCULAR | Status: DC
  Administered 2023-05-12 – 2023-05-13 (×4): 3 [IU] via SUBCUTANEOUS

## 2023-05-12 MED ORDER — SERTRALINE HCL 100 MG PO TABS
100.0000 mg | ORAL_TABLET | Freq: Every day | ORAL | Status: DC
  Administered 2023-05-12 – 2023-05-13 (×2): 100 mg via ORAL
  Filled 2023-05-12 (×2): qty 1

## 2023-05-12 MED ORDER — INSULIN ASPART 100 UNIT/ML IJ SOLN: 3.0000 [IU] | Freq: Three times a day (TID) | INTRAMUSCULAR | Status: DC

## 2023-05-12 MED ORDER — ACETAMINOPHEN 325 MG PO TABS
650.0000 mg | ORAL_TABLET | Freq: Four times a day (QID) | ORAL | Status: DC | PRN
  Administered 2023-05-12: 650 mg via ORAL
  Filled 2023-05-12: qty 2

## 2023-05-12 MED ORDER — ONDANSETRON HCL 4 MG/2ML IJ SOLN
4.0000 mg | Freq: Four times a day (QID) | INTRAMUSCULAR | Status: DC | PRN
  Administered 2023-05-12: 4 mg via INTRAVENOUS
  Filled 2023-05-12: qty 2

## 2023-05-12 NOTE — ED Notes (Signed)
Unable to obtain urine sample at this time. When urinating patient had bowel movement.
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
## 2023-05-12 NOTE — Progress Notes (Signed)
Patient admitted to RM 5W36 from ED. Oriented to room. Patient is alert and oriented. Vital signs done and charted.

## 2023-05-12 NOTE — ED Notes (Signed)
Notified Dr. Antionette Char regarding patient BGL 443. Pt states he takes about 6 units of insulin at home for Bgl that high. Provider has been made aware.

## 2023-05-12 NOTE — Inpatient Diabetes Management (Signed)
Inpatient Diabetes Program Recommendations  AACE/ADA: New Consensus Statement on Inpatient Glycemic Control (2015)  Target Ranges:  Prepandial:   less than 140 mg/dL      Peak postprandial:   less than 180 mg/dL (1-2 hours)      Critically ill patients:  140 - 180 mg/dL   Lab Results  Component Value Date   GLUCAP 330 (H) 05/12/2023   HGBA1C 7.2 (H) 05/12/2023    Latest Reference Range & Units 05/11/23 20:19 05/12/23 04:08 05/12/23 08:06  Glucose-Capillary 70 - 99 mg/dL 284 (H) 132 (H) Novolog 6 units 330 (H) Novolog 4 units  (H): Data is abnormally high  Diabetes history: DM1 Outpatient Diabetes medications: T Slim Insulin Pump Current orders for Inpatient glycemic control: Novolog 0-6 units q 4 hrs.  Inpatient Diabetes Program Recommendations:   Spoke with patient and sisters regarding diabetes management of insulin pump.  Patient's insulin pump settings: Basal insulin    0.7 units/hr Carbohydrate 1 units per 11 gms carbohydrate Correction  1 unit lowers CBGs 30 points Target   110 Total basal = 16.8 units over 24 hrs.  Spoke with NP [facility]'s Nurse Brain Hilts and gave update that patient is in the hospital, had hypoglycemia @ home and may have malfunction with his insulin pump. Nurse to report to NP [facility].  Please consider: -Semglee 16 units now and every day -Add Novolog 4 units tid meal coverage when eating  Thank you, Darel Hong E. Darci Lykins, RN, MSN, CDE  Diabetes Coordinator Inpatient Glycemic Control Team Team Pager [phone] (8am-5pm) 05/12/2023 10:57 AM

## 2023-05-12 NOTE — Procedures (Signed)
Patient Name: Stephen Prince  MRN: [id number]  Epilepsy Attending: Charlsie Quest  Referring Physician/Provider: Lonell Grandchild, MD  Date: 05/12/2023 Duration: 26.27 mins  Patient history:  27 y.o. male history of type 1 diabetes presenting to the emergency department with possible seizure. EEG to evaluate for seizure  Level of alertness: Awake  AEDs during EEG study: None  Technical aspects: This EEG study was done with scalp electrodes positioned according to the 10-20 International system of electrode placement. Electrical activity was reviewed with band pass filter of 1-70Hz , sensitivity of 7 uV/mm, display speed of 18mm/sec with a 60Hz  notched filter applied as appropriate. EEG data were recorded continuously and digitally stored.  Video monitoring was available and reviewed as appropriate.  Description: The posterior dominant rhythm consists of 8-9 Hz activity of moderate voltage (25-35 uV) seen predominantly in posterior head regions, symmetric and reactive to eye opening and eye closing.  Physiologic photic driving was not seen during photic stimulation.  Hyperventilation was not performed.     IMPRESSION: This study is within normal limits. No seizures or epileptiform discharges were seen throughout the recording.  A normal interictal EEG does not exclude the diagnosis of epilepsy.  Delynn Olvera Annabelle Harman

## 2023-05-12 NOTE — H&P (Addendum)
History and Physical    Patient: Stephen Prince ZOX:[id number] DOB: 1996/03/30 DOA: 05/11/2023 DOS: the patient was seen and examined on 05/12/2023 PCP: Noberto Retort, MD  Patient coming from: Home  Chief Complaint:  Chief Complaint  Patient presents with   Seizures   HPI: Stephen Prince is a 27 y.o. 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  Patient presented to the ED via EMS after having seizure-like activity with CBG noted to be around 40.  Patient family had reported he difficulty with pump malfunctioning several times.  Pump was removed and family will be bringing to the hospital later today.  Since arrival to the hospital his CBG has ranged from 175-443-330.  Patient has remained afebrile and normotensive.  Initial WBC 10,800 with follow-up this morning 20,300 he has been mildly tachycardic.  O2 sats have varied between 93 and 95%.  Hemoglobin A1c 7.2.  Because of reported seizure activity CT head was obtained without contrast and this revealed no acute findings.  Hospital service has been consulted to admit the patient for hypoglycemia likely secondary to pump malfunction.  Likely seizure-like activity secondary to profound hypoglycemia.  Review of Systems: As mentioned in the history of present illness. All other systems reviewed and are negative. Past Medical History:  Diagnosis Date   Chronic kidney disease    Diabetes mellitus without complication (HCC)    History reviewed. No pertinent surgical history. Social History:  reports that he has never smoked. He has never used smokeless tobacco. He reports that he does not drink alcohol and does not use drugs.  No Known Allergies  History reviewed. No pertinent family history.  Prior to Admission medications   Medication Sig Start Date End Date Taking? Authorizing Provider  doxycycline (VIBRAMYCIN) 100 MG  capsule Take 100 mg by mouth 2 (two) times daily. 03/31/23  Yes [provider]  insulin aspart (NOVOLOG) 100 UNIT/ML injection Inject into the skin continuous. Uses in insulin pump   Yes [provider]  losartan (COZAAR) 100 MG tablet Take 50 mg by mouth daily.   Yes [provider]  rosuvastatin (CRESTOR) 20 MG tablet Take 20 mg by mouth at bedtime.   Yes [provider]  sertraline (ZOLOFT) 100 MG tablet Take 100 mg by mouth at bedtime.   Yes [provider]  Vitamin D, Ergocalciferol, (DRISDOL) 1.25 MG (50000 UNIT) CAPS capsule Take 50,000 Units by mouth every 7 (seven) days.   Yes [provider]    Physical Exam: Vitals:   05/12/23 8119 05/12/23 0544 05/12/23 0809 05/12/23 0900  BP: (!) 150/90  (!) 109/57   Pulse: (!) 102   100  Resp: 18   20  Temp:   98.1 F (36.7 C)   TempSrc:   Oral   SpO2: 93%   93%  Weight:  73 kg    Height: 5\' 7"  (1.702 m)      Constitutional: NAD, calm, comfortable-sleeping Respiratory: clear to auscultation bilaterally, no wheezing, no crackles. Normal respiratory effort. No accessory muscle use.  Room air Cardiovascular: Regular rate and rhythm, no murmurs / rubs / gallops. No extremity edema. 2+ pedal pulses.   Abdomen: no obvious tenderness, no masses palpated. No hepatosplenomegaly. Bowel sounds positive.  Musculoskeletal: no clubbing / cyanosis. No joint deformity upper and lower extremities. Good ROM, no contractures. Normal muscle tone for the patient resting in bed.  Patient did  turn in the bed independently and no abnormalities noted Skin: no rashes, lesions, ulcers. No induration Neurologic: CN 2-12 grossly intact on visual inspection.  Unable to adequately test sensation or strength given patient asleep. Psychiatric: Sleeping currently. Normal mood.  History at bedside and reports no issues  Data Reviewed:  As per HPI  Assessment and Plan:  Seizure-like activity secondary to profound  hypoglycemia No further similar activity noted since arrival EEG pending  Brittle insulin-dependent diabetes 1/suspected malfunctioning insulin pump Appears to be a recent but recurring problem Pump removed prior to transport.  Family to bring to bedside for diabetes educator to review. Patient CBG increasing and has developed anion gap acidemia.  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 Observation status is judged to be reasonable and necessary in order to provide the required intensity of service to ensure the patient's safety. The patient's presenting symptoms, physical exam findings, and initial radiographic and laboratory data in the context of their medical condition is felt to place them at decreased risk for further clinical deterioration. Furthermore, it is anticipated that the patient will be medically stable for discharge from the hospital within 2 midnights of admission.   Author: Junious Silk, NP 05/12/2023 1:27 PM  For on call review [email or web address].    The patient was Seen and examined independently, the above findings were reviewed independently.  Assessment and plan plan of care was discussed with NP in detail. Been modified accordingly.   Time spent 55 minutes   SIGNED: Kendell Bane, MD, FHM. FAAFP [facility],  Pager (please use [email or web address] to page/text)  Please use Epic Secure Chat for non-urgent communication (7AM-7PM) If 7PM-7AM, please contact  night-coverage [email or web address],  05/12/2023, 2:21 PM

## 2023-05-12 NOTE — ED Notes (Signed)
Patient complaining of headache 5/10.

## 2023-05-12 NOTE — Progress Notes (Signed)
EEG complete - results pending 

## 2023-05-13 ENCOUNTER — Observation Stay (HOSPITAL_COMMUNITY): Payer: 59

## 2023-05-13 ENCOUNTER — Other Ambulatory Visit (HOSPITAL_COMMUNITY): Payer: 59

## 2023-05-13 DIAGNOSIS — E1065 Type 1 diabetes mellitus with hyperglycemia: Secondary | ICD-10-CM | POA: Diagnosis not present

## 2023-05-13 DIAGNOSIS — E871 Hypo-osmolality and hyponatremia: Secondary | ICD-10-CM | POA: Diagnosis present

## 2023-05-13 DIAGNOSIS — E1022 Type 1 diabetes mellitus with diabetic chronic kidney disease: Secondary | ICD-10-CM | POA: Diagnosis present

## 2023-05-13 DIAGNOSIS — E785 Hyperlipidemia, unspecified: Secondary | ICD-10-CM | POA: Diagnosis present

## 2023-05-13 DIAGNOSIS — Z79899 Other long term (current) drug therapy: Secondary | ICD-10-CM | POA: Diagnosis not present

## 2023-05-13 DIAGNOSIS — E10649 Type 1 diabetes mellitus with hypoglycemia without coma: Secondary | ICD-10-CM | POA: Diagnosis present

## 2023-05-13 DIAGNOSIS — N179 Acute kidney failure, unspecified: Secondary | ICD-10-CM | POA: Diagnosis present

## 2023-05-13 DIAGNOSIS — R569 Unspecified convulsions: Secondary | ICD-10-CM | POA: Diagnosis present

## 2023-05-13 DIAGNOSIS — E860 Dehydration: Secondary | ICD-10-CM | POA: Diagnosis present

## 2023-05-13 DIAGNOSIS — Z794 Long term (current) use of insulin: Secondary | ICD-10-CM | POA: Diagnosis not present

## 2023-05-13 DIAGNOSIS — E8881 Metabolic syndrome: Secondary | ICD-10-CM | POA: Diagnosis present

## 2023-05-13 DIAGNOSIS — N184 Chronic kidney disease, stage 4 (severe): Secondary | ICD-10-CM | POA: Diagnosis present

## 2023-05-13 DIAGNOSIS — Z9641 Presence of insulin pump (external) (internal): Secondary | ICD-10-CM | POA: Diagnosis present

## 2023-05-13 DIAGNOSIS — E872 Acidosis, unspecified: Secondary | ICD-10-CM | POA: Diagnosis present

## 2023-05-13 LAB — CBC
HCT: 31.7 % — ABNORMAL LOW (ref 39.0–52.0)
Hemoglobin: 10.5 g/dL — ABNORMAL LOW (ref 13.0–17.0)
MCH: 28.2 pg (ref 26.0–34.0)
MCHC: 33.1 g/dL (ref 30.0–36.0)
MCV: 85 fL (ref 80.0–100.0)
Platelets: 287 10*3/uL (ref 150–400)
RBC: 3.73 MIL/uL — ABNORMAL LOW (ref 4.22–5.81)
RDW: 13.2 % (ref 11.5–15.5)
WBC: 12.8 10*3/uL — ABNORMAL HIGH (ref 4.0–10.5)
nRBC: 0 % (ref 0.0–0.2)

## 2023-05-13 LAB — BASIC METABOLIC PANEL
Anion gap: 9 (ref 5–15)
BUN: 54 mg/dL — ABNORMAL HIGH (ref 6–20)
CO2: 18 mmol/L — ABNORMAL LOW (ref 22–32)
Calcium: 8.2 mg/dL — ABNORMAL LOW (ref 8.9–10.3)
Chloride: 105 mmol/L (ref 98–111)
Creatinine, Ser: 4.11 mg/dL — ABNORMAL HIGH (ref 0.61–1.24)
GFR, Estimated: 20 mL/min — ABNORMAL LOW (ref >60)
Glucose, Bld: 261 mg/dL — ABNORMAL HIGH (ref 70–99)
Potassium: 4.1 mmol/L (ref 3.5–5.1)
Sodium: 132 mmol/L — ABNORMAL LOW (ref 135–145)

## 2023-05-13 LAB — URINALYSIS, ROUTINE W REFLEX MICROSCOPIC
Bilirubin Urine: NEGATIVE
Glucose, UA: 150 mg/dL — AB
Ketones, ur: 5 mg/dL — AB
Leukocytes,Ua: NEGATIVE
Nitrite: NEGATIVE
Protein, ur: >=300 mg/dL — AB
Specific Gravity, Urine: 1.008 (ref 1.005–1.030)
pH: 5 (ref 5.0–8.0)

## 2023-05-13 LAB — GLUCOSE, CAPILLARY
Glucose-Capillary: 125 mg/dL — ABNORMAL HIGH (ref 70–99)
Glucose-Capillary: 125 mg/dL — ABNORMAL HIGH (ref 70–99)
Glucose-Capillary: 133 mg/dL — ABNORMAL HIGH (ref 70–99)
Glucose-Capillary: 256 mg/dL — ABNORMAL HIGH (ref 70–99)
Glucose-Capillary: 86 mg/dL (ref 70–99)

## 2023-05-13 MED ORDER — INSULIN GLARGINE-YFGN 100 UNIT/ML ~~LOC~~ SOLN
16.0000 [IU] | Freq: Every day | SUBCUTANEOUS | Status: DC
  Administered 2023-05-13: 16 [IU] via SUBCUTANEOUS
  Filled 2023-05-13 (×2): qty 0.16

## 2023-05-13 MED ORDER — LACTATED RINGERS IV SOLN: INTRAVENOUS | Status: DC

## 2023-05-13 MED ORDER — SODIUM BICARBONATE 650 MG PO TABS
650.0000 mg | ORAL_TABLET | Freq: Two times a day (BID) | ORAL | Status: DC
  Administered 2023-05-13 – 2023-05-14 (×3): 650 mg via ORAL
  Filled 2023-05-13 (×3): qty 1

## 2023-05-13 NOTE — Progress Notes (Signed)
PROGRESS NOTE                                                                                                                                                                                                             Patient Demographics:    Stephen Prince, is a 27 y.o. male, DOB - 1996/03/14, VOZ:[id number]  Outpatient Primary MD for the patient is Noberto Retort, MD    LOS - 0  Admit date - 05/11/2023    Chief Complaint  Patient presents with   Seizures       Brief Narrative (HPI from H&P)     27 y.o. 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  Patient presented to the ED via EMS after having seizure-like activity with CBG noted to be around 40.  Further workup suggested patient has developed AKI on CKD 4 with renal function significantly worse than his baseline creatinine of 2, this likely has caused his insulin requirements to go down.  He was admitted to the hospital for hypoglycemic seizure.   Subjective:    Stephen Prince today has, No headache, No chest pain, No abdominal pain - No Nausea, No new weakness tingling or numbness, no SOB   Assessment  & Plan :    Seizure caused by metabolic derangement, hypoglycemia.  Head CT and EEG stable, with correction of hypoglycemia mental status stable, currently no AEDs, monitor his CBGs and mental status closely, advance activity PT OT, seizure instructions provided, post discharge one-time follow-up with neurology.  AKI on CKD 4.  Reached out to patient's nephrologist Dr. Willette Pa, his baseline creatinine is around 2.2, this is difficult decline in his renal function likely caused insulin accumulation and hypoglycemia, obtain UA, renal ultrasound, IV fluids and obtain nephrology input.  Dyslipidemia.  On statin.    DM type I with hypoglycemia.   Wears insulin pump, likely hypoglycemia caused by AKI causing his insulin requirements to go down, currently on long-acting insulin along with sliding scale and Premeal NovoLog.  Monitor and adjust.  Post discharge follow-up with patient's endocrinologist the same day if possible.  Lab Results  Component Value Date   HGBA1C 7.4 (H) 05/12/2023   CBG (last 3)  Recent Labs    05/12/23  1234 05/12/23 1605 05/12/23 2054  GLUCAP 340* 266* 235*         Condition - Extremely Guarded  Family Communication  : This is left patient's father Marcial Pacas [phone]  05/13/2023 at 8 AM and again at 8:06 AM.  Code Status :  Full  Consults  :  Renal  PUD Prophylaxis :     Procedures  :     EEG and CT head.  Nonacute.  Renal ultrasound.      Disposition Plan  :    Status is: Observation  DVT Prophylaxis  :    enoxaparin (LOVENOX) injection 30 mg Start: 05/13/23 1500  Lab Results  Component Value Date   PLT 287 05/13/2023    Diet :  Diet Order             Diet Carb Modified Fluid consistency: Thin; Room service appropriate? Yes  Diet effective now                    Inpatient Medications  Scheduled Meds:  enoxaparin (LOVENOX) injection  30 mg Subcutaneous Q24H   insulin aspart  0-5 Units Subcutaneous QHS   insulin aspart  0-6 Units Subcutaneous TID WC   insulin aspart  3 Units Subcutaneous TID WC   insulin glargine-yfgn  16 Units Subcutaneous Daily   rosuvastatin  20 mg Oral QHS   sertraline  100 mg Oral QHS   Continuous Infusions:  lactated ringers 100 mL/hr at 05/13/23 0703   PRN Meds:.acetaminophen, ondansetron (ZOFRAN) IV  Antibiotics  :    Anti-infectives (From admission, onward)    None         Objective:   Vitals:   05/12/23 2100 05/12/23 2200 05/13/23 0000 05/13/23 0400  BP:   108/67 124/76  Pulse: 98 92 88 99  Resp: 20 17 20 15   Temp:   98.1 F (36.7 C)   TempSrc:   Oral   SpO2: 96% 96% 95% 95%  Weight:      Height:        Wt  Readings from Last 3 Encounters:  05/12/23 73 kg  05/07/22 63.5 kg    No intake or output data in the 24 hours ending 05/13/23 0803   Physical Exam  Awake Alert, No new F.N deficits, Normal affect [place].AT,PERRAL Supple Neck, No JVD,   Symmetrical Chest wall movement, Good air movement bilaterally, CTAB RRR,No Gallops,Rubs or new Murmurs,  +ve B.Sounds, Abd Soft, No tenderness,   No Cyanosis, Clubbing or edema     Data Review:    Recent Labs  Lab 05/11/23 2107 05/12/23 0448 05/12/23 1658 05/13/23 0402  WBC 10.8* 20.3* 18.1* 12.8*  HGB 11.3* 11.5* 10.6* 10.5*  HCT 34.6* 35.2* 32.4* 31.7*  PLT 293 272 296 287  MCV 82.8 83.2 83.1 85.0  MCH 27.0 27.2 27.2 28.2  MCHC 32.7 32.7 32.7 33.1  RDW 12.9 13.1 13.1 13.2  LYMPHSABS 1.3  --   --   --   MONOABS 0.8  --   --   --   EOSABS 0.1  --   --   --   BASOSABS 0.0  --   --   --     Recent Labs  Lab 05/11/23 2107 05/11/23 2111 05/12/23 0448 05/12/23 0448 05/12/23 1658 05/13/23 0402  NA 134*  --  133*  --   --  132*  K 4.3  --  4.8  --   --  4.1  CL 104  --  104  --   --  105  CO2 20*  --  16*  --   --  18*  ANIONGAP 10  --  13  --   --  9  GLUCOSE 190*  --  492*  --   --  261*  BUN 38*  --  46*  --   --  54*  CREATININE 3.38*  --  3.47*  --  3.97* 4.11*  AST 25  --   --   --   --   --   ALT 19  --   --   --   --   --   ALKPHOS 65  --   --   --   --   --   BILITOT 0.4  --   --   --   --   --   ALBUMIN 2.5*  --   --   --   --   --   LATICACIDVEN  --  1.6  --   --   --   --   HGBA1C  --   --  7.2*   < > 7.4*  --   CALCIUM 8.2*  --  8.4*  --   --  8.2*   < > = values in this interval not displayed.    Lab Results  Component Value Date   HGBA1C 7.4 (H) 05/12/2023     Radiology Reports EEG adult  Result Date: 05/12/2023 Charlsie Quest, MD     05/12/2023  4:02 PM Patient Name: Stephen Prince MRN: [id number] Epilepsy Attending: Charlsie Quest Referring Physician/Provider: Lonell Grandchild, MD Date: 05/12/2023  Duration: 26.27 mins Patient history:  27 y.o. male history of type 1 diabetes presenting to the emergency department with possible seizure. EEG to evaluate for seizure Level of alertness: Awake AEDs during EEG study: None Technical aspects: This EEG study was done with scalp electrodes positioned according to the 10-20 International system of electrode placement. Electrical activity was reviewed with band pass filter of 1-70Hz , sensitivity of 7 uV/mm, display speed of 28mm/sec with a 60Hz  notched filter applied as appropriate. EEG data were recorded continuously and digitally stored.  Video monitoring was available and reviewed as appropriate. Description: The posterior dominant rhythm consists of 8-9 Hz activity of moderate voltage (25-35 uV) seen predominantly in posterior head regions, symmetric and reactive to eye opening and eye closing. Physiologic photic driving was not seen during photic stimulation.  Hyperventilation was not performed.   IMPRESSION: This study is within normal limits. No seizures or epileptiform discharges were seen throughout the recording. A normal interictal EEG does not exclude the diagnosis of epilepsy. Charlsie Quest   CT Head Wo Contrast  Result Date: 05/11/2023 CLINICAL DATA:  Seizure EXAM: CT HEAD WITHOUT CONTRAST TECHNIQUE: Contiguous axial images were obtained from the base of the skull through the vertex without intravenous contrast. RADIATION DOSE REDUCTION: This exam was performed according to the departmental dose-optimization program which includes automated exposure control, adjustment of the mA and/or kV according to patient size and/or use of iterative reconstruction technique. COMPARISON:  None Available. FINDINGS: Brain: There is no mass, hemorrhage or extra-axial collection. The size and configuration of the ventricles and extra-axial CSF spaces are normal. The brain parenchyma is normal, without acute or chronic infarction. Vascular: No abnormal hyperdensity of  the major intracranial arteries or dural venous sinuses. No intracranial atherosclerosis. Skull: The visualized skull base, calvarium and extracranial soft tissues are normal. Sinuses/Orbits:  No fluid levels or advanced mucosal thickening of the visualized paranasal sinuses. No mastoid or middle ear effusion. The orbits are normal. IMPRESSION: Normal head CT. Electronically Signed   By: Deatra Robinson M.D.   On: 05/11/2023 22:07      Signature  -   Susa Raring M.D on 05/13/2023 at 8:03 AM   -  To page go to [email or web address]

## 2023-05-13 NOTE — Consult Note (Signed)
[facility] Nephrology Consultation Note  Requesting MD: Dr. Thedore Mins, [facility] Reason for consult: AKI on CKD  HPI:  Stephen Prince is a 27 y.o. male with past medical history significant for type 1 insulin-dependent diabetes on insulin pump, diabetic nephropathy with CKD stage IIIb with baseline serum creatinine level around 2.3, HLD,, anxiety who presented to the ER on 8/28 after having seizure-like activity in the setting of hypoglycemia.  Patient was afebrile, BP acceptable and in room air.  The labs showed initial creatinine level of 3.47, BUN 46, potassium 4.8 and leukocytosis. He was admitted to hospital for hypoglycemic seizure.  CT head and EEG unremarkable.  Blood sugar level has been stable and managed by medical team. They will repeat labs this morning showed creatinine level further elevated to 4.11 which triggered nephrology consult.  For CKD patient follows with Dr. Thedore Mins at [facility] and reportedly at the creatinine level was around 2.3 few weeks ago.  He is on losartan which is currently on hold. He was started on IV fluid and had a kidney ultrasound done this morning.  The result is not available yet. The patient denies headache, dizziness, nausea, vomiting, chest pain, shortness of breath.  He was accompanied by his dad at the bedside.  Denies use of NSAIDs.   PMHx:   Past Medical History:  Diagnosis Date   Chronic kidney disease    Diabetes mellitus without complication (HCC)     History reviewed. No pertinent surgical history.  Family Hx: History reviewed. No pertinent family history.  Social History:  reports that he has never smoked. He has never used smokeless tobacco. He reports that he does not drink alcohol and does not use drugs.  Allergies: No Known Allergies  Medications: Prior to Admission medications   Medication Sig Start Date End Date Taking? Authorizing Provider  doxycycline (VIBRAMYCIN) 100 MG capsule Take 100 mg by  mouth 2 (two) times daily. 03/31/23  Yes [provider]  insulin aspart (NOVOLOG) 100 UNIT/ML injection Inject into the skin continuous. Uses in insulin pump   Yes [provider]  losartan (COZAAR) 100 MG tablet Take 50 mg by mouth daily.   Yes [provider]  rosuvastatin (CRESTOR) 20 MG tablet Take 20 mg by mouth at bedtime.   Yes [provider]  sertraline (ZOLOFT) 100 MG tablet Take 100 mg by mouth at bedtime.   Yes [provider]  Vitamin D, Ergocalciferol, (DRISDOL) 1.25 MG (50000 UNIT) CAPS capsule Take 50,000 Units by mouth every 7 (seven) days.   Yes [provider]    I have reviewed the patient's current medications.  Labs: Renal Panel: Recent Labs  Lab 05/11/23 2107 05/12/23 0448 05/12/23 1658 05/13/23 0402  NA 134* 133*  --  132*  K 4.3 4.8  --  4.1  CL 104 104  --  105  CO2 20* 16*  --  18*  GLUCOSE 190* 492*  --  261*  BUN 38* 46*  --  54*  CREATININE 3.38* 3.47* 3.97* 4.11*  CALCIUM 8.2* 8.4*  --  8.2*     CBC:    Latest Ref Rng & Units 05/13/2023    4:02 AM 05/12/2023    4:58 PM 05/12/2023    4:48 AM  CBC  WBC 4.0 - 10.5 K/uL 12.8  18.1  20.3   Hemoglobin 13.0 - 17.0 g/dL 65.7  84.6  96.2   Hematocrit 39.0 - 52.0 % 31.7  32.4  35.2  Platelets 150 - 400 K/uL 287  296  272      Anemia Panel:  Recent Labs    05/11/23 2107 05/12/23 0448 05/12/23 1658 05/13/23 0402  HGB 11.3* 11.5* 10.6* 10.5*  MCV 82.8 83.2 83.1 85.0    Recent Labs  Lab 05/11/23 2107  AST 25  ALT 19  ALKPHOS 65  BILITOT 0.4  PROT 5.5*  ALBUMIN 2.5*    Lab Results  Component Value Date   HGBA1C 7.4 (H) 05/12/2023    ROS:  Pertinent items noted in HPI and remainder of comprehensive ROS otherwise negative.  Physical Exam: Vitals:   05/13/23 0400 05/13/23 0816  BP: 124/76 125/77  Pulse: 99 (!) 101  Resp: 15   Temp:  98 F (36.7 C)  SpO2: 95% 97%     General exam: Appears calm and comfortable   Respiratory system: Clear to auscultation. Respiratory effort normal. No wheezing or crackle Cardiovascular system: S1 & S2 heard, RRR.  No pedal edema. Gastrointestinal system: Abdomen is nondistended, soft and nontender. Normal bowel sounds heard. Central nervous system: Alert and oriented. No focal neurological deficits. Extremities: Symmetric 5 x 5 power. Skin: No rashes, lesions or ulcers Psychiatry: Judgement and insight appear normal. Mood & affect appropriate.   Assessment/Plan:  # Acute kidney injury on CKD stage IIIb, b/l cr 2.3 likely hemodynamically mediated in the setting of dehydration/ARB. Check UA, bladder scan and follow US renal result.  Agree with continuing IV fluid and encourage oral hydration.  Repeat lab in the morning.  I expect gradual renal recovery to his baseline.  Hold losartan today.  # Seizure due to hypoglycemia: Workup unremarkable.  # Hyponatremia: Receiving IV fluid.  # Metabolic acidosis: Start sodium bicarbonate.  # Type 1 diabetes with hyperglycemia: He has insulin pump, management per primary team.  Thank you for the consult.  Will continue to follow with you.  I have discussed with primary team and patient's father.   Taiwana Willison Jaynie Collins 05/13/2023, 12:39 PM  [facility].

## 2023-05-13 NOTE — Plan of Care (Signed)

## 2023-05-13 NOTE — Discharge Instructions (Addendum)
Adjust your insulin pump as directed by your endocrinologist.  Do not drive, operate heavy machinery, perform activities at heights, swimming or participation in water activities or provide baby sitting services until you have seen by Primary MD or a Neurologist and advised to do so again.  Follow with Primary MD Noberto Retort, MD in 2-3 days, follow-up with your endocrinologist and nephrologist within a few days as well.  Get CBC, CMP, 2 view Chest X ray -  checked next visit with your primary MD    Activity: As tolerated with Full fall precautions use walker/cane & assistance as needed  Disposition Home   Diet: Heart Healthy Low Carb  Check your CBGs q. ACH S or monitor your glucose monitor every hour if you are wearing it.  Follow hypoglycemia protocol as instructed.  Special Instructions: If you have smoked or chewed Tobacco  in the last 2 yrs please stop smoking, stop any regular Alcohol  and or any Recreational drug use.  On your next visit with your primary care physician please Get Medicines reviewed and adjusted.  Please request your Prim.MD to go over all Hospital Tests and Procedure/Radiological results at the follow up, please get all Hospital records sent to your Prim MD by signing hospital release before you go home.  If you experience worsening of your admission symptoms, develop shortness of breath, life threatening emergency, suicidal or homicidal thoughts you must seek medical attention immediately by calling 911 or calling your MD immediately  if symptoms less severe.  You Must read complete instructions/literature along with all the possible adverse reactions/side effects for all the Medicines you take and that have been prescribed to you. Take any new Medicines after you have completely understood and accpet all the possible adverse reactions/side effects.

## 2023-05-13 NOTE — Evaluation (Signed)
Physical Therapy Brief Evaluation and Discharge  Patient Details Name: Stephen Prince MRN: [id number] DOB: [birth year] Today's Date: 05/13/2023   History of Present Illness  27 y.o. male presented to the ED via EMS 05/12/23 after having seizure-like activity with CBG noted to be around 40. CT head and EEG no acute changes. ? 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  Clinical Impression   Patient evaluated by Physical Therapy with no further acute PT needs identified. All education has been completed and the patient has no further questions. Patient initially slightly unsteady with desire to hold IV pole while walking. Progressed to walking with no UE support. Patient safe to ambulate with family and RN also approved. Encouraged to walk several times per day to help regain strength/balance.  PT is signing off. Thank you for this referral.        PT Assessment Patient does not need any further PT services  Assistance Needed at Discharge  PRN    Equipment Recommendations None recommended by PT  Recommendations for Other Services       Precautions/Restrictions Precautions Precautions: None        Mobility  Bed Mobility   Supine/Sidelying to sit: Independent Sit to supine/sidelying: Independent    Transfers Overall transfer level: Needs assistance Equipment used: None Transfers: Sit to/from Stand Sit to Stand: Contact guard assist           General transfer comment: pt reported mild dizziness that quickly relieved; CGA for safety no physical assist needed    Ambulation/Gait Ambulation/Gait assistance: Contact guard assist Gait Distance (Feet): 400 Feet Assistive device: IV Pole, None Gait Pattern/deviations: WFL(Within Functional Limits) Gait Speed: Pace WFL General Gait Details: pt reports feeling mildly unsteady; slight imbalance noted when  not holding IV pole x 1 with independent recovery  Home Activity Instructions    Stairs            Modified Rankin (Stroke Patients Only)        Balance Overall balance assessment: Mild deficits observed, not formally tested         Standing balance support: No upper extremity supported Standing balance-Leahy Scale: Fair            Pertinent Vitals/Pain PT - Brief Vital Signs All Vital Signs Stable: Yes Pain Assessment Pain Assessment: Faces Faces Pain Scale: Hurts a little bit Pain Location: all over after fall Pain Descriptors / Indicators: Aching Pain Intervention(s): Limited activity within patient's tolerance     Home Living Family/patient expects to be discharged to:: Private residence Living Arrangements: Parent;Other relatives (sister) Available Help at Discharge: Family;Available 24 hours/day Home Environment: Stairs in home;Level entry;Rail - right  Stairs-Number of Steps: pt does not usually go upstairs Home Equipment: None   Additional Comments: Father works at [employer] and can borrow equipment if needed    Prior Function Level of Independence: Independent      UE/LE Assessment   UE ROM/Strength/Tone/Coordination: [employer]    LE ROM/Strength/Tone/Coordination: [employer]      Communication   Communication Communication: No apparent difficulties     Cognition Overall Cognitive Status: Appears within functional limits for tasks assessed/performed       General Comments General comments (skin integrity, edema, etc.): Father present    Exercises     Assessment/Plan    PT Problem List         PT Visit Diagnosis Difficulty in  walking, not elsewhere classified (R26.2)    No Skilled PT All education completed;Patient will have necessary level of assist by caregiver at discharge   Co-evaluation                AMPAC 6 Clicks Help needed turning from your back to your side while in a flat bed without using bedrails?:  None Help needed moving from lying on your back to sitting on the side of a flat bed without using bedrails?: None Help needed moving to and from a bed to a chair (including a wheelchair)?: A Little Help needed standing up from a chair using your arms (e.g., wheelchair or bedside chair)?: A Little Help needed to walk in hospital room?: A Little Help needed climbing 3-5 steps with a railing? : A Little 6 Click Score: 20      End of Session Equipment Utilized During Treatment: Gait belt Activity Tolerance: Patient tolerated treatment well Patient left: in bed;with call bell/phone within reach;with family/visitor present Nurse Communication: Mobility status;Other (comment) (ok from PT perspective to walk with family) PT Visit Diagnosis: Difficulty in walking, not elsewhere classified (R26.2)     Time: 2956-2130 PT Time Calculation (min) (ACUTE ONLY): 17 min  Charges:   PT Evaluation $PT Eval Low Complexity: 1 Low       Jerolyn Center, PT Acute Rehabilitation Services  Office [phone]   Zena Amos  05/13/2023, 9:30 AM

## 2023-05-14 DIAGNOSIS — R569 Unspecified convulsions: Secondary | ICD-10-CM | POA: Diagnosis not present

## 2023-05-14 LAB — BASIC METABOLIC PANEL
Anion gap: 10 (ref 5–15)
BUN: 39 mg/dL — ABNORMAL HIGH (ref 6–20)
CO2: 23 mmol/L (ref 22–32)
Calcium: 8.2 mg/dL — ABNORMAL LOW (ref 8.9–10.3)
Chloride: 104 mmol/L (ref 98–111)
Creatinine, Ser: 2.57 mg/dL — ABNORMAL HIGH (ref 0.61–1.24)
GFR, Estimated: 35 mL/min — ABNORMAL LOW (ref >60)
Glucose, Bld: 128 mg/dL — ABNORMAL HIGH (ref 70–99)
Potassium: 3.3 mmol/L — ABNORMAL LOW (ref 3.5–5.1)
Sodium: 137 mmol/L (ref 135–145)

## 2023-05-14 LAB — GLUCOSE, CAPILLARY
Glucose-Capillary: 92 mg/dL (ref 70–99)
Glucose-Capillary: 105 mg/dL — ABNORMAL HIGH (ref 70–99)

## 2023-05-14 MED ORDER — POTASSIUM CHLORIDE CRYS ER 20 MEQ PO TBCR
40.0000 meq | EXTENDED_RELEASE_TABLET | Freq: Once | ORAL | Status: AC
  Administered 2023-05-14: 40 meq via ORAL
  Filled 2023-05-14: qty 2

## 2023-05-14 MED ORDER — AMLODIPINE BESYLATE 10 MG PO TABS
10.0000 mg | ORAL_TABLET | Freq: Every day | ORAL | Status: DC
  Administered 2023-05-14: 10 mg via ORAL
  Filled 2023-05-14: qty 1

## 2023-05-14 MED ORDER — DOXYCYCLINE HYCLATE 100 MG PO CAPS: 100.0000 mg | ORAL_CAPSULE | Freq: Two times a day (BID) | ORAL | Status: DC

## 2023-05-14 MED ORDER — HYDRALAZINE HCL 20 MG/ML IJ SOLN
10.0000 mg | Freq: Four times a day (QID) | INTRAMUSCULAR | Status: DC | PRN
  Administered 2023-05-14: 10 mg via INTRAVENOUS
  Filled 2023-05-14: qty 1

## 2023-05-14 MED ORDER — LOSARTAN POTASSIUM 100 MG PO TABS: 50.0000 mg | ORAL_TABLET | Freq: Every day | ORAL | Status: DC

## 2023-05-14 NOTE — Plan of Care (Signed)

## 2023-05-14 NOTE — Progress Notes (Signed)
Discharge instructions discussed with the patient and understanding is verbalized.  BP rechecked and is recorded.  IV removed from [facility], catheter tip intact.  The patient will be escorted out via wheelchair

## 2023-05-14 NOTE — Discharge Summary (Signed)
Stephen Prince WGN:[id number] DOB: 02-Oct-1995 DOA: 05/11/2023  PCP: Stephen Retort, MD  Admit date: 05/11/2023  Discharge date: 05/14/2023  Admitted From: Home   Disposition:  Home   Recommendations for Outpatient Follow-up:   Follow up with PCP in 1-2 weeks  PCP Please obtain BMP/CBC, 2 view CXR in 1week,  (see Discharge instructions)   PCP Please follow up on the following pending results: CBGs and BMP closely, he must follow-up with his endocrinologist and nephrologist within a week.  [facility] in 2 to 3 weeks.   Home Health: None   Equipment/Devices: None  Consultations:   Renal Discharge Condition: Stable    CODE STATUS: Full    Diet Recommendation: Low Carb    Chief Complaint  Patient presents with   Seizures     Brief history of present illness from the day of admission and additional interim summary    27 y.o. 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  Patient presented to the ED via EMS after having seizure-like activity with CBG noted to be around 40.  Further workup suggested patient has developed AKI on CKD 4 with renal function significantly worse than his baseline creatinine of 2, this likely has caused his insulin requirements to go down.  He was admitted to the hospital for hypoglycemic seizure.                                                                  Hospital Course   Seizure caused by metabolic derangement, hypoglycemia.  Head CT and EEG stable, with correction of hypoglycemia mental status stable, currently no AEDs, monitor his CBGs and mental status closely, advance activity PT OT, seizure instructions provided, post discharge one-time  follow-up with neurology.   AKI on CKD 4.  Reached out to patient's nephrologist Dr. Willette Pa, his baseline creatinine is around 2.2, his renal function likely decline from dehydration, with IV fluids renal function close to baseline, hold ARB another day today we will give him Norvasc for blood pressure control, resume ARB from tomorrow follow-up with nephrologist and PCP closely to monitor renal function.  Nonacute renal ultrasound seen by nephrology here as well.   Dyslipidemia.  On statin.     DM type I with hypoglycemia.  Wears insulin pump, likely hypoglycemia caused by AKI causing his insulin requirements to go down, with hydration renal failure back to baseline, he has already started his insulin pump last night, hold further long-acting insulin in the hospital, requested to check CBGs frequently and monitor his insulin meter recordings closely, requested to follow-up with PCP in 2 to 3 days and his endocrinologist within 3 to 4 days as well.  Discharge diagnosis     Principal Problem:   Seizure [facility] Active Problems:   Hypoglycemia associated with diabetes [facility])    Discharge instructions    Discharge Instructions     Discharge instructions   Complete by: As directed    Adjust your insulin pump as directed by your endocrinologist.  Do not drive, operate heavy machinery, perform activities at heights, swimming or participation in water activities or provide baby sitting services until you have seen by Primary MD or a Neurologist and advised to do so again.  Follow with Primary MD Stephen Retort, MD in 2-3 days, follow-up with your endocrinologist and nephrologist within a few days as well.  Get CBC, CMP, 2 view Chest X ray -  checked next visit with your primary MD    Activity: As tolerated with Full fall precautions use walker/cane & assistance as needed  Disposition Home   Diet: Heart Healthy Low Carb  Check your CBGs q. ACH S or monitor your glucose monitor every  hour if you are wearing it.  Follow hypoglycemia protocol as instructed.    Special Instructions: If you have smoked or chewed Tobacco  in the last 2 yrs please stop smoking, stop any regular Alcohol  and or any Recreational drug use.  On your next visit with your primary care physician please Get Medicines reviewed and adjusted.  Please request your Prim.MD to go over all Hospital Tests and Procedure/Radiological results at the follow up, please get all Hospital records sent to your Prim MD by signing hospital release before you go home.  If you experience worsening of your admission symptoms, develop shortness of breath, life threatening emergency, suicidal or homicidal thoughts you must seek medical attention immediately by calling 911 or calling your MD immediately  if symptoms less severe.  You Must read complete instructions/literature along with all the possible adverse reactions/side effects for all the Medicines you take and that have been prescribed to you. Take any new Medicines after you have completely understood and accpet all the possible adverse reactions/side effects.   Increase activity slowly   Complete by: As directed        Discharge Medications   Allergies as of 05/14/2023   No Known Allergies      Medication List     TAKE these medications    doxycycline 100 MG capsule Commonly known as: VIBRAMYCIN Take 1 capsule (100 mg total) by mouth 2 (two) times daily. Finish your course as you were doing, if you have already finished your course then no changes. What changed: additional instructions   insulin aspart 100 UNIT/ML injection Commonly known as: novoLOG Inject into the skin continuous. Uses in insulin pump   losartan 100 MG tablet Commonly known as: COZAAR Take 0.5 tablets (50 mg total) by mouth daily. Start taking on: May 15, 2023   rosuvastatin 20 MG tablet Commonly known as: CRESTOR Take 20 mg by mouth at bedtime.   sertraline 100 MG  tablet Commonly known as: ZOLOFT Take 100 mg by mouth at bedtime.   Vitamin D (Ergocalciferol) 1.25 MG (50000 UNIT) Caps capsule Commonly known as: DRISDOL Take 50,000 Units by mouth every 7 (seven) days.         Follow-up Information     Stephen Retort, MD. Schedule an appointment as soon as possible for a visit in 2 day(s).   Specialty: Family Medicine Why: Go to your endocrinologist office today and get your insulin pump adjusted Contact information: [address] [place] [address] [phone]         [facility]. Schedule an appointment as soon as possible for a visit in 1 week(s).   Contact information: [address]     [address] [place] [address] [phone]        Stephen Retort, MD Follow up.   Specialty: Family Medicine Contact information: [phone] [address] [place] [address] [phone]         Anthony Sar, MD. Schedule an appointment as soon as possible for a visit in 3 day(s).   Specialty: Nephrology Contact information: [address] [place] [address] [phone]                 Major procedures and Radiology Reports - PLEASE review detailed and final reports thoroughly  -      US RENAL  Result Date: 05/13/2023 CLINICAL DATA:  Acute kidney insufficiency EXAM: RENAL / URINARY TRACT ULTRASOUND COMPLETE COMPARISON:  Renal ultrasound 04/15/2021 FINDINGS: Right Kidney: Renal measurements: 11.8 x 5.2 x 6.2 cm = volume: 199.3 mL. Echogenicity within normal limits. No mass or hydronephrosis visualized. Trace perinephric fluid. Left Kidney: Renal measurements: 12.0 x 5.1 x 5.4 cm = volume: 172.8 mL. Echogenicity within normal limits. No mass or hydronephrosis visualized. Bladder: Underdistended urinary bladder.  Slight wall thickening. Other: None. IMPRESSION: No collecting system dilatation. Underdistended urinary bladder with slight wall thickening.  Electronically Signed   By: Karen Kays M.D.   On: 05/13/2023 12:46   EEG adult  Result Date: 05/12/2023 Charlsie Quest, MD     05/12/2023  4:02 PM Patient Name: Stephen Prince MRN: [id number] Epilepsy Attending: Charlsie Quest Referring Physician/Provider: Lonell Grandchild, MD Date: 05/12/2023 Duration: 26.27 mins Patient history:  27 y.o. male history of type 1 diabetes presenting to the emergency department with possible seizure. EEG to evaluate for seizure Level of alertness: Awake AEDs during EEG study: None Technical aspects: This EEG study was done with scalp electrodes positioned according to the 10-20 International system of electrode placement. Electrical activity was reviewed with band pass filter of 1-70Hz , sensitivity of 7 uV/mm, display speed of 73mm/sec with a 60Hz  notched filter applied as appropriate. EEG data were recorded continuously and digitally stored.  Video monitoring was available and reviewed as appropriate. Description: The posterior dominant rhythm consists of 8-9 Hz activity of moderate voltage (25-35 uV) seen predominantly in posterior head regions, symmetric and reactive to eye opening and eye closing. Physiologic photic driving was not seen during photic stimulation.  Hyperventilation was not performed.   IMPRESSION: This study is within normal limits. No seizures or epileptiform discharges were seen throughout the recording. A normal interictal EEG does not exclude the diagnosis of epilepsy. Charlsie Quest   CT Head Wo Contrast  Result Date: 05/11/2023 CLINICAL DATA:  Seizure EXAM: CT HEAD WITHOUT CONTRAST TECHNIQUE: Contiguous axial images were obtained from the base of the skull through the vertex without intravenous contrast. RADIATION DOSE REDUCTION: This exam was performed according to the departmental dose-optimization program which includes automated exposure control, adjustment of the mA and/or kV according to patient size and/or use of iterative  reconstruction technique. COMPARISON:  None Available. FINDINGS: Brain: There is no mass, hemorrhage or extra-axial collection. The size and configuration of the ventricles and extra-axial CSF spaces are normal. The brain parenchyma is normal, without acute or chronic infarction. Vascular: No abnormal hyperdensity of the major intracranial arteries or dural  venous sinuses. No intracranial atherosclerosis. Skull: The visualized skull base, calvarium and extracranial soft tissues are normal. Sinuses/Orbits: No fluid levels or advanced mucosal thickening of the visualized paranasal sinuses. No mastoid or middle ear effusion. The orbits are normal. IMPRESSION: Normal head CT. Electronically Signed   By: Deatra Robinson M.D.   On: 05/11/2023 22:07    Micro Results    No results found for this or any previous visit (from the past 240 hour(s)).  Today   Subjective    Locklan Reilley today has no headache,no chest abdominal pain,no new weakness tingling or numbness, feels much better wants to go home today.    Objective   Blood pressure 150/90, pulse 91, temperature 98.2 F (36.8 C), temperature source Tympanic, resp. rate 15, height 5\' 7"  (1.702 m), weight 73 kg, SpO2 97%.   Intake/Output Summary (Last 24 hours) at 05/14/2023 0801 Last data filed at 05/14/2023 0013 Gross per 24 hour  Intake 1665.96 ml  Output 1100 ml  Net 565.96 ml    Exam  Awake Alert, No new F.N deficits,    [place].AT,PERRAL Supple Neck,   Symmetrical Chest wall movement, Good air movement bilaterally, CTAB RRR,No Gallops,   +ve B.Sounds, Abd Soft, Non tender,  No Cyanosis, Clubbing or edema    Data Review   Recent Labs  Lab 05/11/23 2107 05/12/23 0448 05/12/23 1658 05/13/23 0402  WBC 10.8* 20.3* 18.1* 12.8*  HGB 11.3* 11.5* 10.6* 10.5*  HCT 34.6* 35.2* 32.4* 31.7*  PLT 293 272 296 287  MCV 82.8 83.2 83.1 85.0  MCH 27.0 27.2 27.2 28.2  MCHC 32.7 32.7 32.7 33.1  RDW 12.9 13.1 13.1 13.2  LYMPHSABS 1.3  --   --   --    MONOABS 0.8  --   --   --   EOSABS 0.1  --   --   --   BASOSABS 0.0  --   --   --     Recent Labs  Lab 05/11/23 2107 05/11/23 2111 05/12/23 0448 05/12/23 0448 05/12/23 1658 05/13/23 0402 05/14/23 0324  NA 134*  --  133*  --   --  132* 137  K 4.3  --  4.8  --   --  4.1 3.3*  CL 104  --  104  --   --  105 104  CO2 20*  --  16*  --   --  18* 23  ANIONGAP 10  --  13  --   --  9 10  GLUCOSE 190*  --  492*  --   --  261* 128*  BUN 38*  --  46*  --   --  54* 39*  CREATININE 3.38*  --  3.47*  --  3.97* 4.11* 2.57*  AST 25  --   --   --   --   --   --   ALT 19  --   --   --   --   --   --   ALKPHOS 65  --   --   --   --   --   --   BILITOT 0.4  --   --   --   --   --   --   ALBUMIN 2.5*  --   --   --   --   --   --   LATICACIDVEN  --  1.6  --   --   --   --   --  HGBA1C  --   --  7.2*   < > 7.4*  --   --   CALCIUM 8.2*  --  8.4*  --   --  8.2* 8.2*   < > = values in this interval not displayed.    Total Time in preparing paper work, data evaluation and todays exam - 35 minutes  Signature  -    Susa Raring M.D on 05/14/2023 at 8:01 AM   -  To page go to [email or web address]

## 2023-05-26 ENCOUNTER — Encounter: Payer: Self-pay | Admitting: Neurology

## 2023-06-03 ENCOUNTER — Ambulatory Visit: Payer: 59 | Admitting: Neurology

## 2023-06-03 ENCOUNTER — Encounter: Payer: Self-pay | Admitting: Neurology

## 2023-06-03 VITALS — BP 152/96 | HR 98 | Ht 67.0 in | Wt 160.6 lb

## 2023-06-03 DIAGNOSIS — R569 Unspecified convulsions: Secondary | ICD-10-CM

## 2023-06-03 NOTE — Patient Instructions (Addendum)
Good to meet you.  Schedule MRI brain without contrast  2. Schedule 24-hour EEG  3. Continue working with Endocrinologist on glucose control  4. Follow-up after tests, call for any changes   Seizure Precautions: 1. If medication has been prescribed for you to prevent seizures, take it exactly as directed.  Do not stop taking the medicine without talking to your doctor first, even if you have not had a seizure in a long time.   2. Avoid activities in which a seizure would cause danger to yourself or to others.  Don't operate dangerous machinery, swim alone, or climb in high or dangerous places, such as on ladders, roofs, or girders.  Do not drive unless your doctor says you may.  3. If you have any warning that you may have a seizure, lay down in a safe place where you can't hurt yourself.    4.  No driving for 6 months from last seizure, as per [facility].   Please refer to the following link on the [employer]'s website for more information: http://www.epilepsyfoundation.org/answerplace/Social/driving/drivingu.cfm   5.  Maintain good sleep hygiene.  6.  Contact your doctor if you have any problems that may be related to the medicine you are taking.  7.  Call 911 and bring the patient back to the ED if:        A.  The seizure lasts longer than 5 minutes.       B.  The patient doesn't awaken shortly after the seizure  C.  The patient has new problems such as difficulty seeing, speaking or moving  D.  The patient was injured during the seizure  E.  The patient has a temperature over 102 F (39C)  F.  The patient vomited and now is having trouble breathing

## 2023-06-03 NOTE — Progress Notes (Signed)
NEUROLOGY CONSULTATION NOTE  [name] MRN: [id number] DOB: 1996/07/28  Referring provider: Sanda Klein, PA Primary care provider: Dr. Johny Prince  Reason for consult:  seizure  Thank you for your kind referral of Stephen Prince for consultation of the above symptoms. Although his history is well known to you, please allow me to reiterate it for the purpose of our medical record. The patient was accompanied to the clinic by his sister Stephen Prince who also provides collateral information. Records and images were personally reviewed where available.   HISTORY OF PRESENT ILLNESS: This is a pleasant 27 year old right-handed man with a history of DM1 presenting for evaluation of recurrent seizures. His sister Stephen Prince has witnessed all of them and provides additional information. The first seizure occurred a year ago, he was getting home from work and took insulin. He was working towards getting food when he started feeling his typical hypoglycemic symptoms of blurred vision, diaphoresis, dizziness, and disorientation. He does not recall going down then woke up on his bed with a tongue bite. Stephen Prince had called EMS, she heard him cry out and foun dhim jerking and vomiting. He did not go to the hospital that time. The second occurred sometime in 2023, he was eating this time and felt the same hypoglycemic symptoms then woke up in his bed with a tongue bite, entire body was sore and stiff. The most recent episode was on 05/11/23, he got home from work and was eating when he felt the hypoglycemic symptoms. His pump started to spike a little, giving more insulin, then he woke up in the hospital. He had bitten his tongue and cheek again. Stephen Prince notes that his head was turned to the right side with all 3 seizures, arms flexed with 2 of them. He has had big falls with all of them. He had bounced back pretty easily with the first 2, but with the most recent one, he briefly stopped breathing and was moving around but not  opening his eyes or responding until EMS arrived, then he was very combative. No incontinence. With all of the seizures, glucose levels were in the 40s or below.   Stephen Prince denies any staring episodes. He denies any olfactory/gustatory hallucinations, deja vu, rising epigastric sensation, focal numbness/tingling/weakness, myoclonic jerks. He forgets a lot. He has tremors when hypoglycemic. No headaches, dizziness, diplopia, dysarthria/dysphagia, neck/back pain, bowel/bladder dysfunction. He gets 4-7 hours of sleep. When his glucose is low, he cannot sleep. He denies any sleep deprivation prior to the seizures, no alcohol. He feels physically fine now but "down in the dumps" due to driving restrictions. He was born with a nuchal cord, normal early development.  There is no history of febrile convulsions, CNS infections such as meningitis/encephalitis, significant traumatic brain injury, neurosurgical procedures, or family history of seizures.  Diagnostic Data: EEGs: EEG on 05/12/23 normal MRI: none available. Head CT normal   PAST MEDICAL HISTORY: Past Medical History:  Diagnosis Date   Chronic kidney disease    Diabetes mellitus without complication (HCC)     PAST SURGICAL HISTORY: History reviewed. No pertinent surgical history.  MEDICATIONS: Current Outpatient Medications on File Prior to Visit  Medication Sig Dispense Refill   doxycycline (VIBRAMYCIN) 100 MG capsule Take 1 capsule (100 mg total) by mouth 2 (two) times daily. Finish your course as you were doing, if you have already finished your course then no changes.     insulin aspart (NOVOLOG) 100 UNIT/ML injection Inject into the skin continuous.  Uses in insulin pump     losartan (COZAAR) 100 MG tablet Take 0.5 tablets (50 mg total) by mouth daily. (Patient taking differently: Take 100 mg by mouth daily.)     rosuvastatin (CRESTOR) 20 MG tablet Take 20 mg by mouth at bedtime.     sertraline (ZOLOFT) 100 MG tablet Take 150 mg by mouth at  bedtime.     Vitamin D, Ergocalciferol, (DRISDOL) 1.25 MG (50000 UNIT) CAPS capsule Take 50,000 Units by mouth every 7 (seven) days.     No current facility-administered medications on file prior to visit.    ALLERGIES: No Known Allergies  FAMILY HISTORY: Family History  Problem Relation Age of Onset   Diabetes Mother    Hypertension Father     SOCIAL HISTORY: Social History   Socioeconomic History   Marital status: Single    Spouse name: Not on file   Number of children: Not on file   Years of education: Not on file   Highest education level: Not on file  Occupational History   Not on file  Tobacco Use   Smoking status: Never   Smokeless tobacco: Never  Vaping Use   Vaping status: Never Used  Substance and Sexual Activity   Alcohol use: Never   Drug use: Never   Sexual activity: Not on file  Other Topics Concern   Not on file  Social History Narrative   Are you right handed or left handed? right   Are you currently employed ?  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 No    Who lives with you? Family    What type of home do you live in: 1 story or 2 story?  2 story stays on 1st level        Social Determinants of Health   Financial Resource Strain: Not on file  Food Insecurity: No Food Insecurity (05/12/2023)   Hunger Vital Sign    Worried About Running Out of Food in the Last Year: Never true    Ran Out of Food in the Last Year: Never true  Transportation Needs: No Transportation Needs (05/12/2023)   PRAPARE - Administrator, Civil Service (Medical): No    Lack of Transportation (Non-Medical): No  Physical Activity: Not on file  Stress: Not on file  Social Connections: Not on file  Intimate Partner Violence: Not At Risk (05/12/2023)   Humiliation, Afraid, Rape, and Kick questionnaire    Fear of Current or Ex-Partner: No    Emotionally Abused: No    Physically Abused: No    Sexually  Abused: No     PHYSICAL EXAM: Vitals:   06/03/23 1020  BP: (!) 152/96  Pulse: 98  SpO2: 98%   General: No acute distress Head:  Normocephalic/atraumatic Skin/Extremities: No rash, no edema Neurological Exam: Mental status: alert and oriented to person, place, and time, no dysarthria or aphasia, Fund of knowledge is appropriate.  Recent and remote memory are intact, 3/3 delayed recall.  Attention and concentration are normal, 5/5 WORLD backwards.  Cranial nerves: CN I: not tested CN II: pupils equal, round, visual fields intact CN III, IV, VI:  full range of motion, no nystagmus, no ptosis CN V: facial sensation intact CN VII: upper and lower face symmetric CN VIII: hearing intact to conversation Bulk & Tone: normal, no fasciculations. Motor: 5/5 throughout with no pronator drift. Sensation: intact to  light touch, cold, pin, vibration sense.  No extinction to double simultaneous stimulation.  Romberg test negative Deep Tendon Reflexes: brisk +2 throughout Cerebellar: no incoordination on finger to nose testing Gait: narrow-based and steady, able to tandem walk adequately. Tremor: none   IMPRESSION: This is a pleasant 27 year old right-handed man with a history of DM1 presenting for evaluation of recurrent seizures. He has had 3 convulsions that have all occurred in the setting of hypoglycemia. His sister has witnessed them and reports right head deviation with all the seizures, raising concern for focal seizures (which can occur with hypoglycemia). We discussed doing further evaluation to assess for an underlying tendency for seizures (ie epilepsy), MRI brain without contrast and 24-hour EEG will be ordered for classification. Continue follow-up with Endocrinology for glucose control. [place] driving laws were discussed with the patient, and he knows to stop driving after a seizure, until 6 months seizure-free. Follow-up after tests, call for any changes.    Thank you for allowing me to  participate in the care of this patient. Please do not hesitate to call for any questions or concerns.   Patrcia Dolly, M.D.  CC: Dr. Tiburcio Pea, Stephen Prince, [place]

## 2023-06-11 IMAGING — US US RENAL
1 series · 14 of 25 positions shown · non-contrast
Comparison: None.

CLINICAL DATA: Chronic kidney disease stage 1.

EXAM:
RENAL / URINARY TRACT ULTRASOUND COMPLETE

[Series 1: us renal · 0.23mm/px · 14 of 33 slices shown]
[im 1/33]
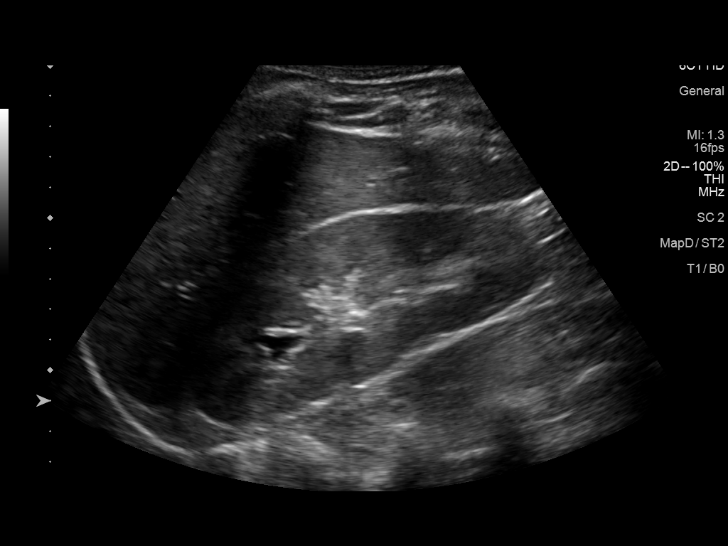
[im 3/33]
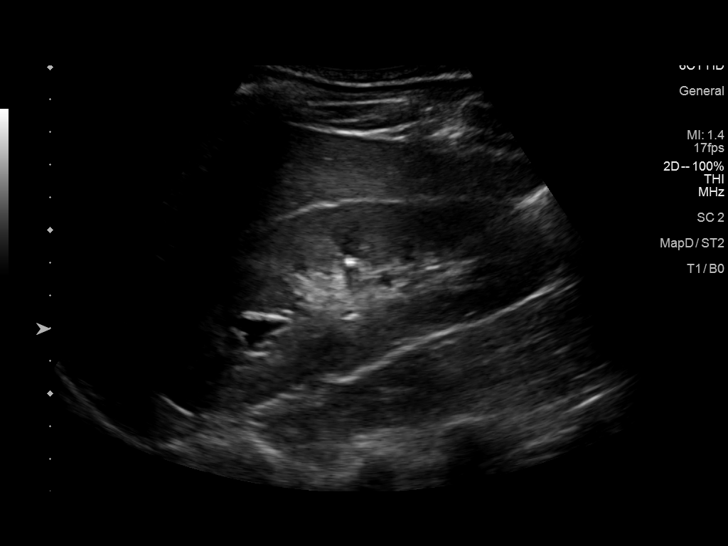
[im 6/33]
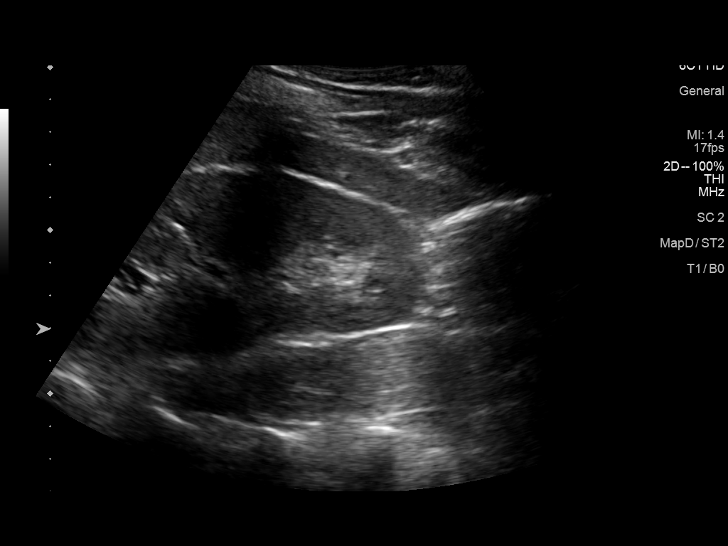
[im 9/33]
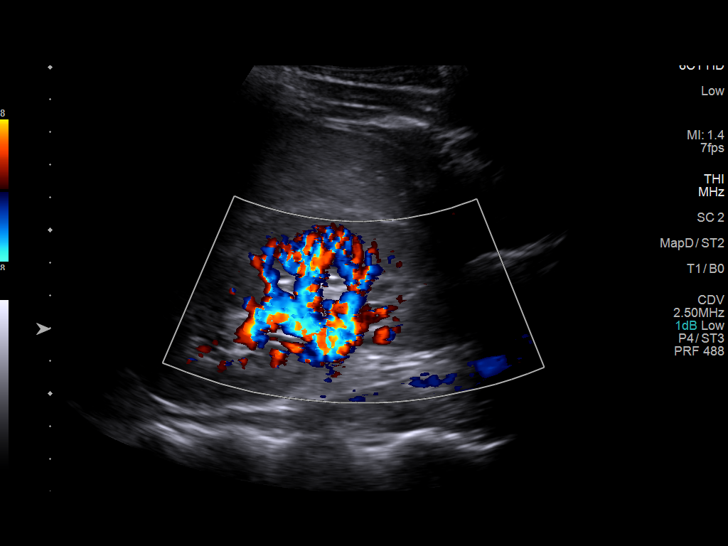
[im 11/33]
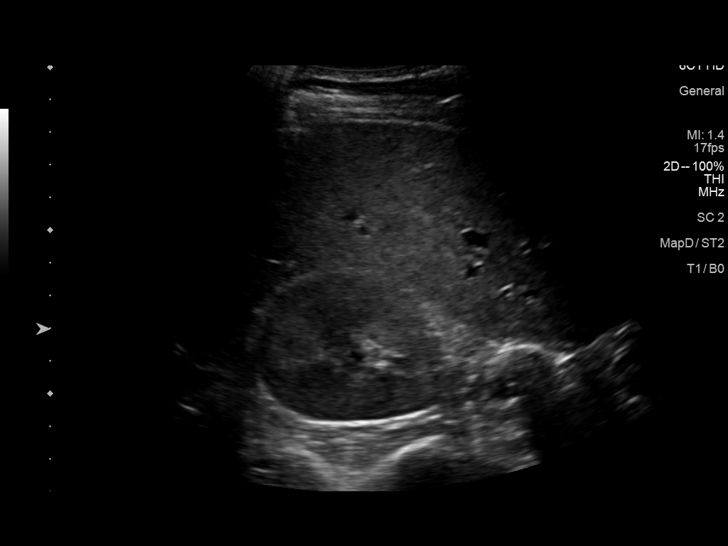
[im 13/33]
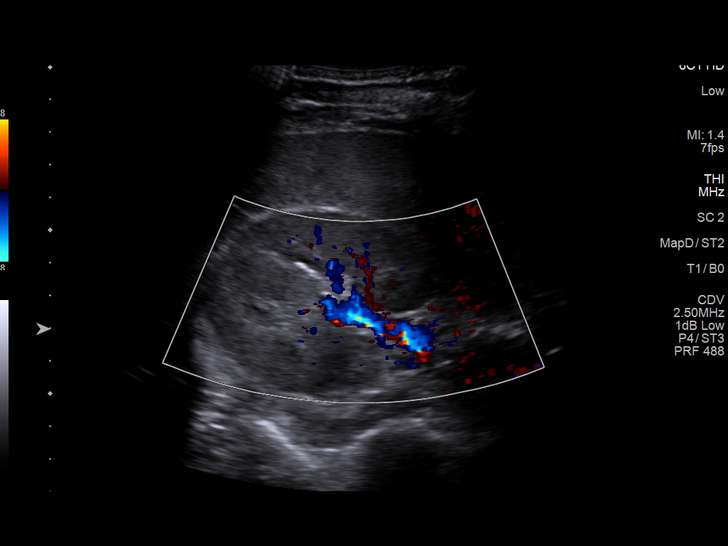
[im 15/33]
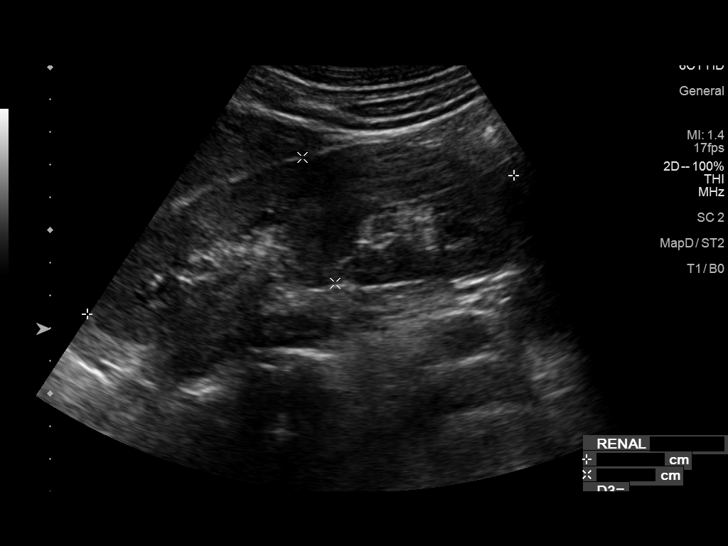
[im 18/33]
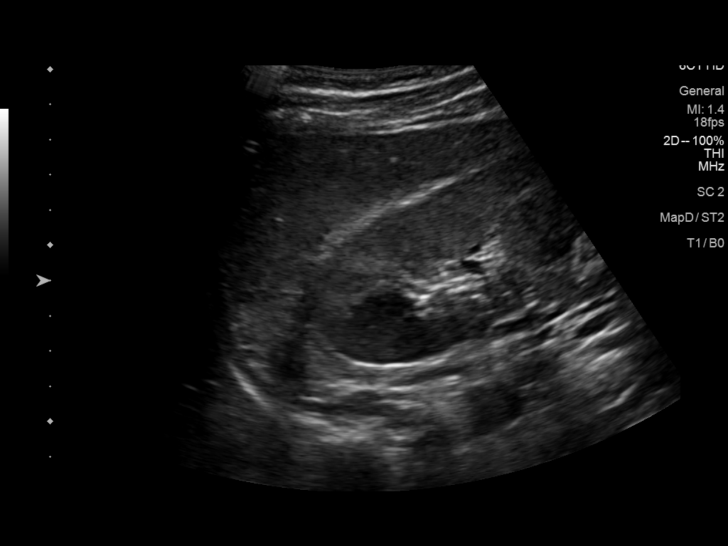
[im 21/33]
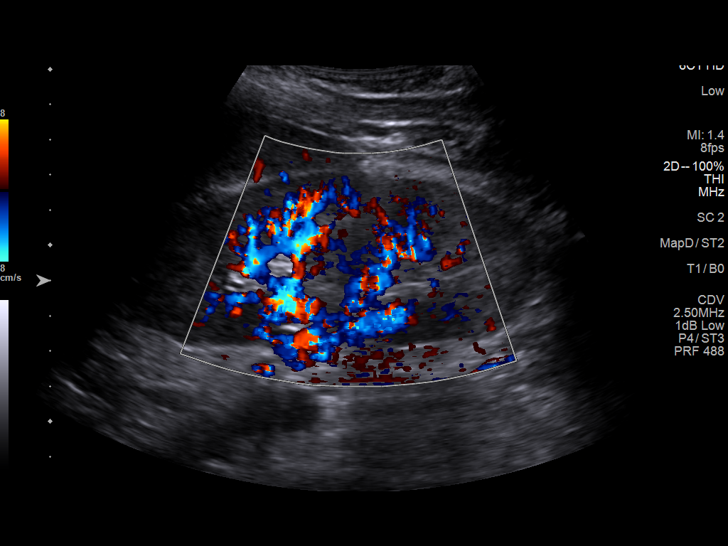
[im 22/33]
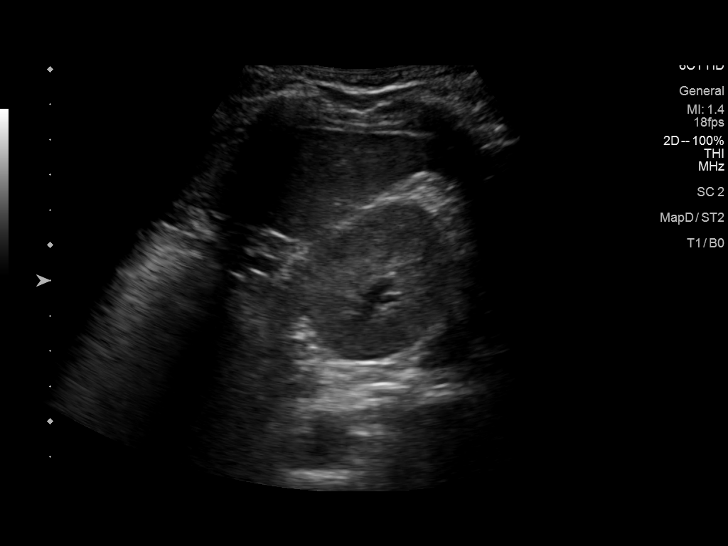
[im 25/33]
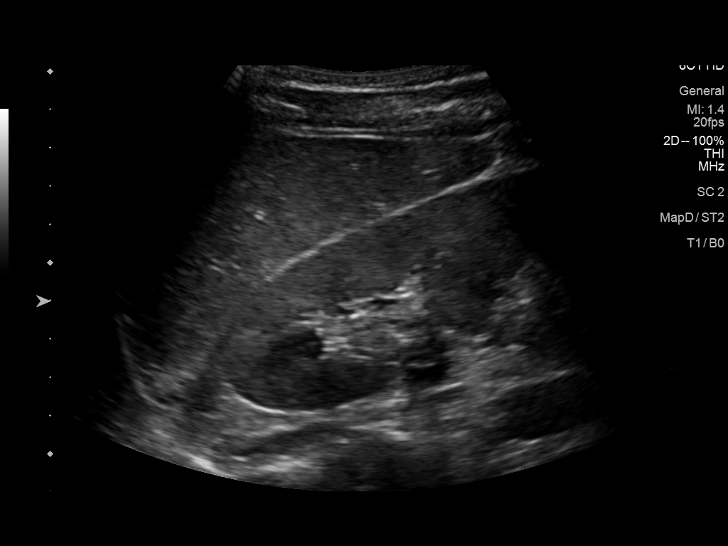
[im 27/33]
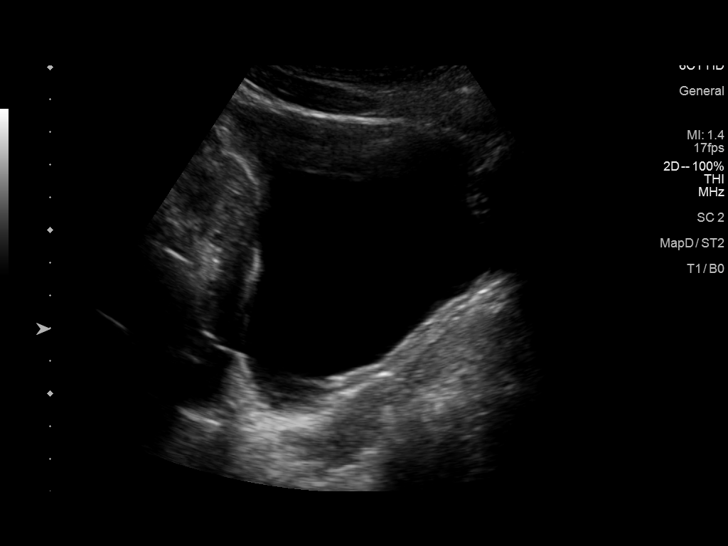
[im 30/33]
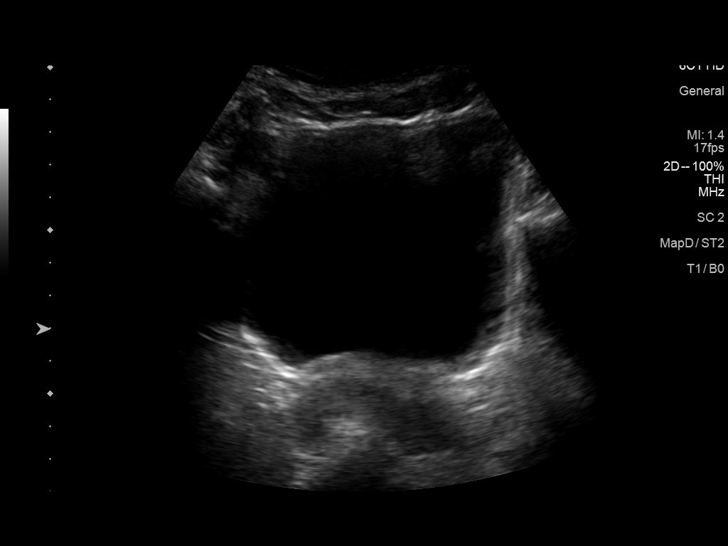
[im 33/33]
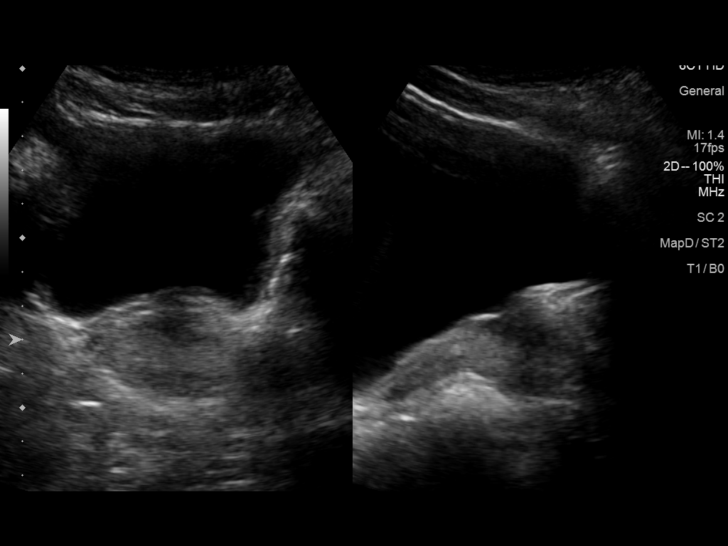

[14 of 25 positions shown; findings below may reference images not displayed]

FINDINGS: Right Kidney:

Renal measurements: 12.4 x 5.4 x 6.3 cm = volume: 220 mL. Mild
cortical thickening at 2.3 cm. Normal parenchymal echogenicity. No
hydronephrosis. No visualized stone or focal lesion.

Left Kidney:

Renal measurements: 13.7 x 4.0 x 6.1 cm = volume: 175 mL. Mild
cortical thickening at 2.2 cm. Normal parenchymal echogenicity. No
hydronephrosis. No visualized stone or focal lesion.

Bladder:

Appears normal for degree of bladder distention.

Other:

None.
IMPRESSION: 1. Cortical thickening of both kidneys at 2.3 cm, of unknown
significance. This could be considered baseline for follow-up exams.
2. Otherwise unremarkable sonographic appearance of the kidneys and
bladder.

## 2023-06-26 ENCOUNTER — Ambulatory Visit
Admission: RE | Admit: 2023-06-26 | Discharge: 2023-06-26 | Disposition: A | Discharge status: Home / Self Care | Payer: 59 | Source: Ambulatory Visit | Attending: Neurology | Admitting: Neurology

## 2023-06-26 DIAGNOSIS — R569 Unspecified convulsions: Secondary | ICD-10-CM

## 2023-07-06 ENCOUNTER — Ambulatory Visit: Payer: 59 | Admitting: Neurology

## 2023-07-09 ENCOUNTER — Other Ambulatory Visit: Payer: Self-pay

## 2023-07-09 ENCOUNTER — Encounter (HOSPITAL_COMMUNITY): Payer: Self-pay

## 2023-07-09 ENCOUNTER — Emergency Department (HOSPITAL_COMMUNITY)
Admission: EM | Admit: 2023-07-09 | Discharge: 2023-07-09 | Disposition: A | Discharge status: Home / Self Care | Payer: 59 | Attending: Emergency Medicine | Admitting: Emergency Medicine

## 2023-07-09 DIAGNOSIS — R000 Tachycardia, unspecified: Secondary | ICD-10-CM | POA: Insufficient documentation

## 2023-07-09 DIAGNOSIS — N184 Chronic kidney disease, stage 4 (severe): Secondary | ICD-10-CM | POA: Diagnosis not present

## 2023-07-09 DIAGNOSIS — R569 Unspecified convulsions: Secondary | ICD-10-CM | POA: Insufficient documentation

## 2023-07-09 DIAGNOSIS — Z79899 Other long term (current) drug therapy: Secondary | ICD-10-CM | POA: Insufficient documentation

## 2023-07-09 DIAGNOSIS — Z794 Long term (current) use of insulin: Secondary | ICD-10-CM | POA: Diagnosis not present

## 2023-07-09 DIAGNOSIS — E1022 Type 1 diabetes mellitus with diabetic chronic kidney disease: Secondary | ICD-10-CM | POA: Diagnosis not present

## 2023-07-09 DIAGNOSIS — E10649 Type 1 diabetes mellitus with hypoglycemia without coma: Secondary | ICD-10-CM | POA: Insufficient documentation

## 2023-07-09 LAB — CBC
HCT: 33.4 % — ABNORMAL LOW (ref 39.0–52.0)
Hemoglobin: 11.3 g/dL — ABNORMAL LOW (ref 13.0–17.0)
MCH: 27.7 pg (ref 26.0–34.0)
MCHC: 33.8 g/dL (ref 30.0–36.0)
MCV: 81.9 fL (ref 80.0–100.0)
Platelets: 376 10*3/uL (ref 150–400)
RBC: 4.08 MIL/uL — ABNORMAL LOW (ref 4.22–5.81)
RDW: 13.1 % (ref 11.5–15.5)
WBC: 13.1 10*3/uL — ABNORMAL HIGH (ref 4.0–10.5)
nRBC: 0 % (ref 0.0–0.2)

## 2023-07-09 LAB — BASIC METABOLIC PANEL
Anion gap: 10 (ref 5–15)
BUN: 46 mg/dL — ABNORMAL HIGH (ref 6–20)
CO2: 22 mmol/L (ref 22–32)
Calcium: 8.4 mg/dL — ABNORMAL LOW (ref 8.9–10.3)
Chloride: 101 mmol/L (ref 98–111)
Creatinine, Ser: 3.08 mg/dL — ABNORMAL HIGH (ref 0.61–1.24)
GFR, Estimated: 28 mL/min — ABNORMAL LOW (ref >60)
Glucose, Bld: 187 mg/dL — ABNORMAL HIGH (ref 70–99)
Potassium: 3.8 mmol/L (ref 3.5–5.1)
Sodium: 133 mmol/L — ABNORMAL LOW (ref 135–145)

## 2023-07-09 LAB — CBG MONITORING, ED: Glucose-Capillary: 163 mg/dL — ABNORMAL HIGH (ref 70–99)

## 2023-07-09 MED ORDER — ONDANSETRON 4 MG PO TBDP
8.0000 mg | ORAL_TABLET | Freq: Once | ORAL | Status: AC
  Administered 2023-07-09: 8 mg via ORAL
  Filled 2023-07-09: qty 2

## 2023-07-09 MED ORDER — ONDANSETRON HCL 4 MG/2ML IJ SOLN
4.0000 mg | Freq: Once | INTRAMUSCULAR | Status: AC
  Administered 2023-07-09: 4 mg via INTRAVENOUS
  Filled 2023-07-09: qty 2

## 2023-07-09 MED ORDER — ONDANSETRON HCL 4 MG PO TABS
4.0000 mg | ORAL_TABLET | Freq: Four times a day (QID) | ORAL | 0 refills | Status: AC
Start: 2023-07-09 — End: ?

## 2023-07-09 NOTE — ED Notes (Signed)
Pt dexcom reading "HI", family placed insulin pump back on per provider once CBG read over 400. Will reassess. Provider notified.

## 2023-07-09 NOTE — ED Notes (Signed)
Pt vomited after PO zofran, provider notified.

## 2023-07-09 NOTE — ED Notes (Signed)
Received verbal order from Gareth Eagle, PA-C for patient to be monitored until 0800 for seizure activity and for hypoglycemia. Per provider use patients personal dexcom for CBG monitoring.

## 2023-07-09 NOTE — Discharge Instructions (Addendum)
Evaluation was overall reassuring. Please follow-up with your PCP.  If your blood sugar is low again, you have a seizure, arrhythmia, or any other concern please return emerged for further evaluation.

## 2023-07-09 NOTE — ED Provider Notes (Signed)
[facility] Provider Note   CSN: [id number] Arrival date & time: 07/09/23  0345     History  Chief Complaint  Patient presents with   Seizures   Hypoglycemia    Stephen Prince is a 27 y.o. male.  Patient presents to the emergency department via EMS with reported full body seizure.  Patient is a type I diabetic with an insulin pump.  The patient had a low blood glucose of 53 and his father got him to drink a soda.  Before the patient was able to drink a soda his father heard him fall and found him on the floor seizing.  The seizure reportedly lasted for approximately 10 to 15 seconds, was described as full body in nature.  The patient had a postictal period which lasted for approximate 25 minutes after the seizure which was still resolving at the time of his arrival at the emergency department.  Upon arrival at the emergency department the patient had consumed some of the soda and his CBG was 163.  The patient was recently admitted due to a seizure thought to be related to hypoglycemia and AKI superimposed on stage IV CKD.  He has had multiple follow-up ointment since that time with both endocrinology and nephrology as well as primary care.  The patient endorses biting his tongue during the incident but denies any urinary incontinence.  He denies chest pain, shortness of breath, headache, trauma other than the tongue biting.   Seizures Hypoglycemia Associated symptoms: seizures        Home Medications Prior to Admission medications   Medication Sig Start Date End Date Taking? Authorizing Provider  Dextromethorphan-Guaifenesin 60-1200 MG 12hr tablet Take 1 tablet by mouth daily.   Yes [provider]  DM-Doxylamine-Acetaminophen (NYQUIL COLD & FLU PO) Take 30 mLs by mouth at bedtime as needed (For sinusitis).   Yes [provider]  doxycycline (VIBRAMYCIN) 100 MG capsule Take 1 capsule (100 mg total) by mouth 2 (two) times  daily. Finish your course as you were doing, if you have already finished your course then no changes. 05/14/23  Yes Leroy Sea, MD  Glucagon (GVOKE HYPOPEN 2-PACK) 1 MG/0.2ML SOAJ Inject 1 pen  into the skin once as needed (For severe hypoglycemia). 05/20/23  Yes [provider]  insulin aspart (NOVOLOG) 100 UNIT/ML injection Inject into the skin continuous. Uses in insulin pump   Yes [provider]  losartan (COZAAR) 100 MG tablet Take 0.5 tablets (50 mg total) by mouth daily. Patient taking differently: Take 100 mg by mouth at bedtime. 05/15/23  Yes Leroy Sea, MD  rosuvastatin (CRESTOR) 20 MG tablet Take 20 mg by mouth at bedtime.   Yes [provider]  sertraline (ZOLOFT) 100 MG tablet Take 150 mg by mouth daily.    [provider]  Vitamin D, Ergocalciferol, (DRISDOL) 1.25 MG (50000 UNIT) CAPS capsule Take 50,000 Units by mouth once a week.    [provider]      Allergies    Patient has no known allergies.    Review of Systems   Review of Systems  Neurological:  Positive for seizures.    Physical Exam Updated Vital Signs BP (!) 144/100   Pulse 99   Temp 98.3 F (36.8 C) (Oral)   Resp 14   Ht 5\' 7"  (1.702 m)   Wt 72.6 kg   SpO2 99%   BMI 25.06 kg/m  Physical Exam Vitals and  nursing note reviewed.  Constitutional:      General: He is not in acute distress.    Appearance: He is well-developed.  HENT:     Head: Normocephalic and atraumatic.  Eyes:     Extraocular Movements: Extraocular movements intact.     Conjunctiva/sclera: Conjunctivae normal.     Pupils: Pupils are equal, round, and reactive to light.  Cardiovascular:     Rate and Rhythm: Normal rate and regular rhythm.     Heart sounds: No murmur heard. Pulmonary:     Effort: Pulmonary effort is normal. No respiratory distress.     Breath sounds: Normal breath sounds.  Abdominal:     Palpations: Abdomen is soft.     Tenderness: There is no abdominal  tenderness.  Musculoskeletal:        General: No swelling.     Cervical back: Neck supple.  Skin:    General: Skin is warm and dry.     Capillary Refill: Capillary refill takes less than 2 seconds.  Neurological:     General: No focal deficit present.     Mental Status: He is alert. Mental status is at baseline.  Psychiatric:        Mood and Affect: Mood normal.     ED Results / Procedures / Treatments   Labs (all labs ordered are listed, but only abnormal results are displayed) Labs Reviewed  BASIC METABOLIC PANEL - Abnormal; Notable for the following components:      Result Value   Sodium 133 (*)    Glucose, Bld 187 (*)    BUN 46 (*)    Creatinine, Ser 3.08 (*)    Calcium 8.4 (*)    GFR, Estimated 28 (*)    All other components within normal limits  CBC - Abnormal; Notable for the following components:   WBC 13.1 (*)    RBC 4.08 (*)    Hemoglobin 11.3 (*)    HCT 33.4 (*)    All other components within normal limits  CBG MONITORING, ED - Abnormal; Notable for the following components:   Glucose-Capillary 163 (*)    All other components within normal limits  CBG MONITORING, ED    EKG EKG Interpretation Date/Time:  Friday July 09 2023 03:56:16 EDT Ventricular Rate:  106 PR Interval:  144 QRS Duration:  70 QT Interval:  336 QTC Calculation: 447 R Axis:   70  Text Interpretation: Sinus tachycardia Borderline T wave abnormalities No significant change since last tracing Confirmed by Zadie Rhine ([id number]) on 07/09/2023 4:00:16 AM  Radiology No results found.  Procedures Procedures    Medications Ordered in ED Medications - No data to display  ED Course/ Medical Decision Making/ A&P                                 Medical Decision Making Amount and/or Complexity of Data Reviewed Labs: ordered.   This patient presents to the ED for concern of seizure, this involves an extensive number of treatment options, and is a complaint that carries with it a  high risk of complications and morbidity.  The differential diagnosis includes epilepsy, hypoglycemic induced seizure, others   Co morbidities that complicate the patient evaluation  Seizures, type 1 diabetes with history of hypoglycemia   Additional history obtained:  Additional history obtained from EMS and patient's father External records from outside source obtained and reviewed including primary care notes from follow-up  from recent admission   Lab Tests:  I Ordered, and personally interpreted labs.  The pertinent results include: Initial CBG 163, creatinine 3.08 (2.57 at discharge on August 30)   Test / Admission - Considered:  Patient care being signed out to oncoming provider at shift handoff.  Plan to observe patient until 8 AM.  If patient continues to be seizure-free and glucose does not drop significantly patient should be stable for discharge home.  Suspect seizure activity in setting of hypoglycemia.  Patient is closely followed by both nephrology and endocrinology and has planned outpatient follow-up including 24-hour EEG.  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

## 2023-07-09 NOTE — ED Notes (Signed)
Reconfirmed plan of care with Gareth Eagle, PA-C to continue to monitor CBG from patients dexcom, and monitor patient until 0800 for seizure activity & hypoglycemia.

## 2023-07-09 NOTE — ED Triage Notes (Signed)
Patients initial cbg was 53 and dad got hiim up to drink a soda but dad heard him fall and found him on the floor seizing.  CBG with ems 103.  Patient is postictal with mild confusion.  Hx of seizures when glucose drops.

## 2023-07-09 NOTE — ED Provider Notes (Signed)
Accepted handoff at shift change from [facility], [place]. Please see prior provider note for more detail.   Briefly: Patient is 27 y.o. presenting for hypoglycemia and seizure.  DDX: concern for epilepsy, hypoglycemic induced seizure, others   Plan: Obs until 8am. If no seizure and not hypoglycemic can be d/c. Turn on pump if over 400.    Physical Exam  BP (!) 137/97   Pulse 92   Temp 98.3 F (36.8 C) (Oral)   Resp 18   Ht 5\' 7"  (1.702 m)   Wt 72.6 kg   SpO2 98%   BMI 25.06 kg/m   Physical Exam  Procedures  Procedures  ED Course / MDM   Clinical Course as of 07/09/23 0741  Fri Jul 09, 2023  0631 Hypoglycemia with seizure (10 to 15 s). Has insulin pump. Obs until 8am. If no seizure and not hypoglycemic can go. Turn on pump if over 400.  [JR]    Clinical Course User Index [JR] Gareth Eagle, PA-C   Medical Decision Making Amount and/or Complexity of Data Reviewed Labs: ordered.   On reassessment, patient stated he was feeling much better but somewhat nauseous. Given one-time dose of Zofran.  Able to ambulate to the bathroom.  Patient expressed strong desire to leave. Turned his insulin pump back on due to elevated blood sugar.  Discussed return precautions.  Vital stable.  Discharge.       Gareth Eagle, PA-C 07/09/23 1556    Wynetta Fines, MD 07/10/23 [phone]

## 2023-07-12 ENCOUNTER — Encounter: Payer: Self-pay | Admitting: Neurology

## 2023-07-12 ENCOUNTER — Ambulatory Visit: Payer: 59 | Admitting: Neurology

## 2023-07-12 VITALS — BP 135/86 | HR 103 | Ht 67.0 in | Wt 163.6 lb

## 2023-07-12 DIAGNOSIS — R569 Unspecified convulsions: Secondary | ICD-10-CM | POA: Diagnosis not present

## 2023-07-12 MED ORDER — LAMOTRIGINE 25 MG PO TABS: ORAL_TABLET | ORAL | 3 refills | Status: DC

## 2023-07-12 NOTE — Patient Instructions (Addendum)
Good to see you.  Start Lamotrigine 25mg : take 1 tablet twice a day for 2 weeks, then increase to 2 tablets twice a day and continue  2. Schedule 24-hour EEG  3. Continue working with Endocrinologist on glucose control  4. Follow-up after EEG, call for any changes   Seizure Precautions: 1. If medication has been prescribed for you to prevent seizures, take it exactly as directed.  Do not stop taking the medicine without talking to your doctor first, even if you have not had a seizure in a long time.   2. Avoid activities in which a seizure would cause danger to yourself or to others.  Don't operate dangerous machinery, swim alone, or climb in high or dangerous places, such as on ladders, roofs, or girders.  Do not drive unless your doctor says you may.  3. If you have any warning that you may have a seizure, lay down in a safe place where you can't hurt yourself.    4.  No driving for 6 months from last seizure, as per [facility].   Please refer to the following link on the [employer]ile[employer] of [employer]'s website for more information: http://www.epilepsyfoundation.org/answerplace/Social/driving/drivingu.cfm   5.  Maintain good sleep hygiene.  6.  Contact your doctor if you have any problems that may be related to the medicine you are taking.  7.  Call 911 and bring the patient back to the ED if:        A.  The seizure lasts longer than 5 minutes.       B.  The patient doesn't awaken shortly after the seizure  C.  The patient has new problems such as difficulty seeing, speaking or moving  D.  The patient was injured during the seizure  E.  The patient has a temperature over 102 F (39C)  F.  The patient vomited and now is having trouble breathing

## 2023-07-15 ENCOUNTER — Ambulatory Visit: Payer: 59 | Admitting: Podiatry

## 2023-07-19 ENCOUNTER — Encounter: Payer: Self-pay | Admitting: Neurology

## 2023-07-22 ENCOUNTER — Encounter: Payer: Self-pay | Admitting: Podiatry

## 2023-07-22 ENCOUNTER — Other Ambulatory Visit: Payer: 59

## 2023-07-22 ENCOUNTER — Ambulatory Visit (INDEPENDENT_AMBULATORY_CARE_PROVIDER_SITE_OTHER): Payer: 59 | Admitting: Podiatry

## 2023-07-22 VITALS — Ht 67.0 in | Wt 163.0 lb

## 2023-07-22 DIAGNOSIS — L84 Corns and callosities: Secondary | ICD-10-CM | POA: Diagnosis not present

## 2023-07-22 DIAGNOSIS — S90821A Blister (nonthermal), right foot, initial encounter: Secondary | ICD-10-CM

## 2023-07-22 DIAGNOSIS — E1042 Type 1 diabetes mellitus with diabetic polyneuropathy: Secondary | ICD-10-CM | POA: Diagnosis not present

## 2023-07-22 NOTE — Progress Notes (Signed)
  Subjective:  Patient ID: Stephen Prince, male    DOB: Feb 08, 1996,  MRN: [id number]  Chief Complaint  Patient presents with   Diabetes    Patient is here for routine [facility], corn right great toe , heel cracked on left foot    27 y.o. male presents with the above complaint. History confirmed with patient. Patient presenting for diabetic foot evaluation and due to concern for calluses.  He has a callus primarily on the right hallux.  Concerned there may be a small blister there.  Denies any drainage.  Denies pain  Objective:  Physical Exam: warm, good capillary refill nail exam normal nails without lesions DP pulses palpable, PT pulses palpable, and protective sensation absent Left Foot: Hyperkeratotic lesion present medial aspect of the hallux IPJ.  Small blister present at this location underlying the hyperkeratotic lesion.  Serous fluid was drained.  Upon debridement no evidence of fibrotic tissue necrosis or purulence.  No malodor no sign of infection.  No surrounding erythema Right Foot: Hyperkeratotic lesion mild skin fissuring at the distal aspect of the left hallux plantarly  Assessment:   1. Blister of right foot, initial encounter   2. Pre-ulcerative calluses   3. Type 1 diabetes mellitus with diabetic polyneuropathy (HCC)        Plan:  Patient was evaluated and treated and all questions answered.   #Hyperkeratotic lesions/pre ulcerative calluses present right hallux medial IPJ   #blister of right foot  -Debrided hyperkeratotic lesion with 15 blade without incident -Patient developed a small serous blister underlying he has hyperkeratotic lesion preulcerative callus present on the right hallux medial IPJ. -With 15 blade I debrided the blister and hyperkeratotic tissue present on the right hallux.  There was some small amount of drainage of serous fluid.  Underlying was raw skin.  No evidence of infection -Recommend daily antibiotic ointment until the area is healed.  Cover  with a Band-Aid and change daily. -Encourage patient that if there is any worsening of the area including redness swelling drainage fails to heal within reasonable time approximately 2 to 3 weeks etc. he should call and come in for appointment or if he gets sick to go to the emergency department.  Return in about 3 months (around 10/22/2023) for [facility].         Corinna Gab, DPM [facility]

## 2023-08-19 ENCOUNTER — Other Ambulatory Visit: Payer: 59

## 2023-08-26 ENCOUNTER — Ambulatory Visit: Payer: 59 | Admitting: Dermatology

## 2023-09-13 ENCOUNTER — Ambulatory Visit: Payer: 59 | Admitting: Neurology

## 2023-09-13 DIAGNOSIS — R569 Unspecified convulsions: Secondary | ICD-10-CM

## 2023-11-04 ENCOUNTER — Encounter: Payer: Self-pay | Admitting: Podiatry

## 2023-11-04 ENCOUNTER — Ambulatory Visit: Payer: BC Managed Care – PPO | Admitting: Podiatry

## 2023-11-04 VITALS — Ht 67.0 in | Wt 163.0 lb

## 2023-11-04 DIAGNOSIS — E1042 Type 1 diabetes mellitus with diabetic polyneuropathy: Secondary | ICD-10-CM

## 2023-11-04 DIAGNOSIS — L84 Corns and callosities: Secondary | ICD-10-CM

## 2023-11-04 DIAGNOSIS — M79676 Pain in unspecified toe(s): Secondary | ICD-10-CM

## 2023-11-04 DIAGNOSIS — B351 Tinea unguium: Secondary | ICD-10-CM

## 2023-11-04 NOTE — Progress Notes (Signed)
  Subjective:  Patient ID: Stephen Prince, male    DOB: 1995/09/23,  MRN: [id number]  Chief Complaint  Patient presents with   [facility]    He is here for a diaetic nail trim, Last A1C was 'between 6 and 7"    28 y.o. male presents with the above complaint. History confirmed with patient.  Patient coming in for assistance with thickened dystrophic nails.  He is also concerned with callus formation to the right first toe at site of previous blister.  Patient since that last visit he has had a blood blister formed here.  Patient does  have a history of T2DM.   Objective:  Physical Exam: warm, good capillary refill nail exam onychomycosis of the toenails and dystrophic nails DP pulses palpable, PT pulses palpable, and protective sensation absent Left Foot:  Pain with palpation of nails due to elongation and dystrophic growth.  Hyperkeratotic lesion with mild skin fissuring distal aspect of left hallux plantarly.  Mild callus formation left fifth toe distal tuft. Right Foot: Pain with palpation of nails due to elongation and dystrophic growth.  Plantar medial interphalangeal joint most notable at the proximal phalangeal head there is hyperkeratotic tissue with underlying fissure at site of blood blister with some dried heme present.  Assessment:   1. Pre-ulcerative calluses   2. Type 1 diabetes mellitus with diabetic polyneuropathy (HCC)   3. Pain due to onychomycosis of toenail      Plan:  Patient was evaluated and treated and all questions answered.  #Pre ulcerative calluses present right plantar medial first toe, and first proximal phalanx and medial aspect All symptomatic hyperkeratoses x 1 separate lesions were safely debrided with a sterile #10 blade to patient's level of comfort without incident. We discussed preventative and palliative care of these lesions including supportive and accommodative shoegear, padding, prefabricated and custom molded accommodative orthoses, use of a pumice stone  and lotions/creams daily. -Patient present with area of underlying bleeding.  Silvadene and bandage applied.  Instructed patient to monitor the site and continue with moisturization with urea creams going forward -Follow-up in 2 to 3 weeks if this does not improve.  #Onychomycosis with pain  -Nails palliatively debrided as below. -Educated on self-care  Procedure: Nail Debridement Rationale: Pain Type of Debridement: manual, sharp debridement. Instrumentation: Nail nipper, rotary burr. Number of Nails: 10  Return in about 3 months (around 02/01/2024) for Diabetic Foot Care.         Bronwen Betters, DPM [facility] / [facility]

## 2023-11-04 NOTE — Patient Instructions (Addendum)
Look for urea 40% cream or ointment and apply to the thickened dry skin / calluses. This can be bought over the counter, at a pharmacy or online such as [employer].  Can also look for combination product with salicylic acid.  There are some lower concentration over-the-counter options available at some drugstores including AmLactin or Lac-Hydrin.  He can also scrub the calluses with white vinegar prior to showering and this will make it easier to gently file down with a pumice stone or emery Painter.   More silicone pads can be purchased from:  [email or web address].com/retail/  Recommend looking for poor shoe pads. I also recommend trying crest hammertoe pads which can help your left 5th toe

## 2023-11-05 ENCOUNTER — Encounter: Payer: Self-pay | Admitting: Neurology

## 2023-11-05 MED ORDER — LAMOTRIGINE 25 MG PO TABS: ORAL_TABLET | ORAL | 6 refills | Status: DC

## 2023-11-12 ENCOUNTER — Encounter: Payer: Self-pay | Admitting: Neurology

## 2023-11-12 ENCOUNTER — Ambulatory Visit (INDEPENDENT_AMBULATORY_CARE_PROVIDER_SITE_OTHER): Payer: Medicaid Other | Admitting: Neurology

## 2023-11-12 VITALS — BP 145/91 | HR 103 | Ht 67.0 in | Wt 160.6 lb

## 2023-11-12 DIAGNOSIS — R569 Unspecified convulsions: Secondary | ICD-10-CM

## 2023-11-12 NOTE — Patient Instructions (Signed)
 Good to see you. Continue Lamotrigine 25mg : take 2 tablets twice a day. Follow-up in 5 months, call for any changes.    Seizure Precautions: 1. If medication has been prescribed for you to prevent seizures, take it exactly as directed.  Do not stop taking the medicine without talking to your doctor first, even if you have not had a seizure in a long time.   2. Avoid activities in which a seizure would cause danger to yourself or to others.  Don't operate dangerous machinery, swim alone, or climb in high or dangerous places, such as on ladders, roofs, or girders.  Do not drive unless your doctor says you may.  3. If you have any warning that you may have a seizure, lay down in a safe place where you can't hurt yourself.    4.  No driving for 6 months from last seizure, as per [facility].   Please refer to the following link on the Epile[employer] of [employer]'s website for more information: http://www.epilepsyfoundation.org/answerplace/Social/driving/drivingu.cfm   5.  Maintain good sleep hygiene. Avoid alcohol  6.  Contact your doctor if you have any problems that may be related to the medicine you are taking.  7.  Call 911 and bring the patient back to the ED if:        A.  The seizure lasts longer than 5 minutes.       B.  The patient doesn't awaken shortly after the seizure  C.  The patient has new problems such as difficulty seeing, speaking or moving  D.  The patient was injured during the seizure  E.  The patient has a temperature over 102 F (39C)  F.  The patient vomited and now is having trouble breathing

## 2023-11-12 NOTE — Progress Notes (Signed)
 NEUROLOGY FOLLOW UP OFFICE NOTE  Stephen Prince [id number] 1996-09-08  HISTORY OF PRESENT ILLNESS: I had the pleasure of seeing Stephen Prince in follow-up in the neurology clinic on 11/12/2023.  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 He is alone in the office today. Records and images were personally reviewed where available.  He had a 24-hour EEG in 08/2023 which was normal, typical events not captured. He was started on Lamotrigine 50mg  BID on last visit due to continued seizures in the setting of labile glucose levels.He denies any seizures since 06/2024, no side effects on Lamotrigine. He denies any staring/unresponsive episodes, gaps in time, olfactory/gustatory hallucinations, focal numbness/tingling/weakness, myoclonic jerks. No headaches, dizziness, vision changes, no falls. His last HbA1c was 7.3, he reports he is more disciplined and insulin is more figured, with 64 being lowest glucose level. He gets 5-7 hours of sleep, he is a light sleeper. He has been referred to [facility] for consideration for kidney transplant.    he has not done the ambulatory EEG. Unfortunately, since his last visit, he had another GTC on 07/09/23. He does not remember much of it. His glucose level was low in sleep, this alerted his father and sister, who gave him a sode, glucose was 52. He got up because glucose stayed low, it dropped to 42. He was grabbing food out of the fridge when he fell to the floor convulsing for 4-5 seconds. It took him longer to come out of it. Glucose was 163 in the ER, creatinine 3.08. He bit his tongue, no incontinence. He denies any staring/unresponsive episodes, gaps in time, olfactory/gustatory hallucinations, focal numbness/tingling/weakness, myoclonic jerks. No headaches, dizziness, vision changes, no falls. Sleep and mood are good.    History on Initial Assessment 06/03/2023: This is a pleasant 28 year old right-handed man with  a history of DM1 presenting for evaluation of recurrent seizures. His sister Stephen Prince has witnessed all of them and provides additional information. The first seizure occurred a year ago, he was getting home from work and took insulin. He was working towards getting food when he started feeling his typical hypoglycemic symptoms of blurred vision, diaphoresis, dizziness, and disorientation. He does not recall going down then woke up on his bed with a tongue bite. Stephen Prince had called EMS, she heard him cry out and foun dhim jerking and vomiting. He did not go to the hospital that time. The second occurred sometime in 2023, he was eating this time and felt the same hypoglycemic symptoms then woke up in his bed with a tongue bite, entire body was sore and stiff. The most recent episode was on 05/11/23, he got home from work and was eating when he felt the hypoglycemic symptoms. His pump started to spike a little, giving more insulin, then he woke up in the hospital. He had bitten his tongue and cheek again. Stephen Prince notes that his head was turned to the right side with all 3 seizures, arms flexed with 2 of them. He has had big falls with all of them. He had bounced back pretty easily with the first 2, but with the most recent one, he briefly stopped breathing and was moving around but not opening his eyes or responding until EMS arrived, then he was very combative. No incontinence. With all of the seizures, glucose levels were in the 40s or below.   Stephen Prince denies any staring episodes. He denies any olfactory/gustatory hallucinations, deja vu, rising  epigastric sensation, focal numbness/tingling/weakness, myoclonic jerks. He forgets a lot. He has tremors when hypoglycemic. No headaches, dizziness, diplopia, dysarthria/dysphagia, neck/back pain, bowel/bladder dysfunction. He gets 4-7 hours of sleep. When his glucose is low, he cannot sleep. He denies any sleep deprivation prior to the seizures, no alcohol. He feels physically fine now but  "down in the dumps" due to driving restrictions. He was born with a nuchal cord, normal early development.  There is no history of febrile convulsions, CNS infections such as meningitis/encephalitis, significant traumatic brain injury, neurosurgical procedures, or family history of seizures.  Diagnostic Data: EEGs: EEG on 05/12/23 normal 24-hour EEG 08/2023 normal, typical events not captured  MRI brain without contrast 06/2023 normal  PAST MEDICAL HISTORY: Past Medical History:  Diagnosis Date   Chronic kidney disease    Diabetes mellitus without complication (HCC)     MEDICATIONS: Current Outpatient Medications on File Prior to Visit  Medication Sig Dispense Refill   Dextromethorphan-Guaifenesin 60-1200 MG 12hr tablet Take 1 tablet by mouth daily.     DM-Doxylamine-Acetaminophen (NYQUIL COLD & FLU PO) Take 30 mLs by mouth at bedtime as needed (For sinusitis).     doxycycline (VIBRAMYCIN) 100 MG capsule Take 1 capsule (100 mg total) by mouth 2 (two) times daily. Finish your course as you were doing, if you have already finished your course then no changes.     Glucagon (GVOKE HYPOPEN 2-PACK) 1 MG/0.2ML SOAJ Inject 1 pen  into the skin once as needed (For severe hypoglycemia).     insulin aspart (NOVOLOG) 100 UNIT/ML injection Inject into the skin continuous. Uses in insulin pump     lamoTRIgine (LAMICTAL) 25 MG tablet Take 2 tablets twice a day 120 tablet 6   losartan (COZAAR) 100 MG tablet Take 0.5 tablets (50 mg total) by mouth daily. (Patient taking differently: Take 100 mg by mouth at bedtime.)     ondansetron (ZOFRAN) 4 MG tablet Take 1 tablet (4 mg total) by mouth every 6 (six) hours. 12 tablet 0   rosuvastatin (CRESTOR) 20 MG tablet Take 20 mg by mouth at bedtime.     sertraline (ZOLOFT) 100 MG tablet Take 150 mg by mouth daily.     Vitamin D, Ergocalciferol, (DRISDOL) 1.25 MG (50000 UNIT) CAPS capsule Take 50,000 Units by mouth once a week.     No current facility-administered  medications on file prior to visit.    ALLERGIES: No Known Allergies  FAMILY HISTORY: Family History  Problem Relation Age of Onset   Diabetes Mother    Hypertension Father     SOCIAL HISTORY: Social History   Socioeconomic History   Marital status: Single    Spouse name: Not on file   Number of children: Not on file   Years of education: Not on file   Highest education level: Not on file  Occupational History   Not on file  Tobacco Use   Smoking status: Never   Smokeless tobacco: Never  Vaping Use   Vaping status: Never Used  Substance and Sexual Activity   Alcohol use: Never   Drug use: Never   Sexual activity: Not on file  Other Topics Concern   Not on file  Social History Narrative   Are you right handed or left handed? right   Are you currently employed ?  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 No    Who lives with  you? Family    What type of home do you live in: 1 story or 2 story?  2 story stays on 1st level        Social Drivers of Health   Financial Resource Strain: Not on file  Food Insecurity: No Food Insecurity (05/12/2023)   Hunger Vital Sign    Worried About Running Out of Food in the Last Year: Never true    Ran Out of Food in the Last Year: Never true  Transportation Needs: No Transportation Needs (05/12/2023)   PRAPARE - Administrator, Civil Service (Medical): No    Lack of Transportation (Non-Medical): No  Physical Activity: Not on file  Stress: Not on file  Social Connections: Not on file  Intimate Partner Violence: Not At Risk (05/12/2023)   Humiliation, Afraid, Rape, and Kick questionnaire    Fear of Current or Ex-Partner: No    Emotionally Abused: No    Physically Abused: No    Sexually Abused: No     PHYSICAL EXAM: Vitals:   11/12/23 1602  BP: (!) 145/91  Pulse: (!) 103  SpO2: 100%   General: No acute distress Head:   Normocephalic/atraumatic Skin/Extremities: No rash, no edema Neurological Exam: alert and awake. No aphasia or dysarthria. Fund of knowledge is appropriate.  Attention and concentration are normal.   Cranial nerves: Pupils equal, round. Extraocular movements intact with no nystagmus. Visual fields full.  No facial asymmetry.  Motor: Bulk and tone normal, muscle strength 5/5 throughout with no pronator drift.   Finger to nose testing intact.  Gait narrow-based and steady, able to tandem walk adequately.  Romberg negative.   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 His sister has witnessed them and reports right head deviation with all the seizures, raising concern for focal seizures (which can occur with hypoglycemia). MRI brain without contrast and 24-hour EEG normal. He was started on Lamotrigine as a therapeutic trial due to difficulty with labile glucose levels, no seizures since 06/2023 on Lamotrigine 50mg  BID. He reports glucose levels are also better. He is aware of [place] driving laws to stop driving after a seizure until 6 months seizure-free. Follow-up in 5 months, call for any changes.    Thank you for allowing me to participate in his care.  Please do not hesitate to call for any questions or concerns.    Patrcia Dolly, M.D.   CC: Dr. Tiburcio Pea

## 2023-12-09 ENCOUNTER — Encounter: Payer: Self-pay | Admitting: Dermatology

## 2023-12-09 ENCOUNTER — Ambulatory Visit: Payer: 59 | Admitting: Dermatology

## 2023-12-09 VITALS — BP 176/92

## 2023-12-09 DIAGNOSIS — L700 Acne vulgaris: Secondary | ICD-10-CM

## 2023-12-09 DIAGNOSIS — L720 Epidermal cyst: Secondary | ICD-10-CM

## 2023-12-09 DIAGNOSIS — L729 Follicular cyst of the skin and subcutaneous tissue, unspecified: Secondary | ICD-10-CM

## 2023-12-09 MED ORDER — CLINDAMYCIN PHOSPHATE 1 % EX SWAB: 1.0000 | Freq: Every morning | CUTANEOUS | 3 refills | Status: DC

## 2023-12-09 MED ORDER — TRETINOIN 0.025 % EX CREA
TOPICAL_CREAM | Freq: Every day | CUTANEOUS | 0 refills | Status: DC
Start: 2023-12-09 — End: 2024-01-07

## 2023-12-09 NOTE — Patient Instructions (Addendum)
 Hello Rayvion,  Thank you for visiting today. Here is a summary of the key instructions:  - Medications:   - Use clindamycin swabs on face in the morning after washing   - Apply tretinoin 0.025% cream to face every other night   - Use a pea-sized amount for the whole face  - Skin Care:   - Apply moisturizer and sunscreen after morning face wash   - Avoid using exfoliators with salicylic acid for now  - Planned Procedures:   - Schedule cyst removal with Dr. Caralyn Guile   - After cyst removal:     - Avoid swimming and hot tubs for about 2 weeks  - Follow-up:   - Return for follow-up appointment in 4 months   Please reach out if you have any questions or concerns.  Best Regards,  Dr. [facility]     Important Information  Due to recent changes in healthcare laws, you may see results of your pathology and/or laboratory studies on MyChart before the doctors have had a chance to review them. We understand that in some cases there may be results that are confusing or concerning to you. Please understand that not all results are received at the same time and often the doctors may need to interpret multiple results in order to provide you with the best plan of care or course of treatment. Therefore, we ask that you please give [place] 2 business days to thoroughly review all your results before contacting the office for clarification. Should we see a critical lab result, you will be contacted sooner.   If You Need Anything After Your Visit  If you have any questions or concerns for your doctor, please call our main line at [phone] If no one answers, please leave a voicemail as directed and we will return your call as soon as possible. Messages left after 4 pm will be answered the following business day.   You may also send [place] a message via MyChart. We typically respond to MyChart messages within 1-2 business days.  For prescription refills, please ask your pharmacy to  contact our office. Our fax number is [phone].  If you have an urgent issue when the clinic is closed that cannot wait until the next business day, you can page your doctor at the number below.    Please note that while we do our best to be available for urgent issues outside of office hours, we are not available 24/7.   If you have an urgent issue and are unable to reach [place], you may choose to seek medical care at your doctor's office, retail clinic, urgent care center, or emergency room.  If you have a medical emergency, please immediately call 911 or go to the emergency department. In the event of inclement weather, please call our main line at [phone] for an update on the status of any delays or closures.  Dermatology Medication Tips: Please keep the boxes that topical medications come in in order to help keep track of the instructions about where and how to use these. Pharmacies typically print the medication instructions only on the boxes and not directly on the medication tubes.   If your medication is too expensive, please contact our office at [phone] or send [place] a message through MyChart.   We are unable to tell what your co-pay for medications will be in advance as this is different depending on your insurance coverage. However, we may be able to find a substitute  medication at lower cost or fill out paperwork to get insurance to cover a needed medication.   If a prior authorization is required to get your medication covered by your insurance company, please allow [place] 1-2 business days to complete this process.  Drug prices often vary depending on where the prescription is filled and some pharmacies may offer cheaper prices.  The website www.goodrx.com contains coupons for medications through different pharmacies. The prices here do not account for what the cost may be with help from insurance (it may be cheaper with your insurance), but the website can give you the price  if you did not use any insurance.  - You can print the associated coupon and take it with your prescription to the pharmacy.  - You may also stop by our office during regular business hours and pick up a GoodRx coupon card.  - If you need your prescription sent electronically to a different pharmacy, notify our office through [facility] or by phone at [phone]

## 2023-12-09 NOTE — Progress Notes (Signed)
   New Patient Visit   Subjective  Stephen Prince is a 28 y.o. male who presents for the following: Cyst and acne  The patient presents with a history of acne. He reports previous treatment with both oral and topical medications, including doxycycline. The patient recalls taking doxycycline daily for approximately one year, but discontinued use due to transferring pharmacies. He previously experienced acne on his back, which cleared with doxycycline treatment.  The patient also has a cyst on his upper central back that has been present for a couple of years. He denies any tenderness or infection associated with the cyst.    The patient has spots, moles and lesions to be evaluated, some may be new or changing and the patient may have concern these could be cancer.   The following portions of the chart were reviewed this encounter and updated as appropriate: medications, allergies, medical history  Review of Systems:  No other skin or systemic complaints except as noted in HPI or Assessment and Plan.  Objective  Well appearing patient in no apparent distress; mood and affect are within normal limits.   A focused examination was performed of the following areas: Acne and Cyst  Relevant exam findings are noted in the Assessment and Plan.      Assessment & Plan   1. Acne vulgaris  - Assessment: Patient presents with a history of acne, previously treated with doxycycline and unspecified topical medications. Current examination reveals inflammatory papules on the cheeks and acne scarring on the cheeks and forehead. Patient reports previous truncal acne that resolved with doxycycline treatment.  - Plan:    Initiate topical regimen:     - Clindamycin swabs in the morning after face washing, followed by moisturizer and sunscreen     - Tretinoin 0.025% cream every other night, using pea-sized amount for whole face    Avoid exfoliators with salicylic acid initially    Follow up in 4  months    Consider adding doxycycline if acne not significantly improved at follow-up    Provide samples of gentle cleansers and moisturizers  2. Epidermal inclusion cyst - Assessment: 3.5 cm soft subcutaneous cystic nodule located on the upper central back, present for a couple of years. Non-tender and non-infected. - Plan:    Schedule surgical removal with Dr. Caralyn Guile    Procedure details:     - Local anesthesia with lidocaine     - Incision and cyst removal     - Specimen to be sent for laboratory testing     - Closure with dissolvable sutures    Post-procedure instructions:     - Avoid swimming and hot tubs for about 2 weeks after surgery    Return in about 4 months (around 04/09/2024) for for acne follow up . Schedule Surgery with Dr. Caralyn Guile .  Stephen Prince, CMA, am acting as scribe for [employer], DO.   Documentation: I have reviewed the above documentation for accuracy and completeness, and I agree with the above.  Stephen Reusing, DO

## 2024-01-03 ENCOUNTER — Encounter: Payer: Self-pay | Admitting: Dermatology

## 2024-01-05 ENCOUNTER — Encounter: Payer: Self-pay | Admitting: Dermatology

## 2024-01-05 ENCOUNTER — Other Ambulatory Visit (HOSPITAL_COMMUNITY): Payer: Self-pay | Admitting: *Deleted

## 2024-01-05 ENCOUNTER — Ambulatory Visit: Admitting: Dermatology

## 2024-01-05 VITALS — BP 154/93 | HR 99 | Temp 97.4°F

## 2024-01-05 DIAGNOSIS — D492 Neoplasm of unspecified behavior of bone, soft tissue, and skin: Secondary | ICD-10-CM

## 2024-01-05 DIAGNOSIS — D485 Neoplasm of uncertain behavior of skin: Secondary | ICD-10-CM

## 2024-01-05 DIAGNOSIS — L720 Epidermal cyst: Secondary | ICD-10-CM | POA: Diagnosis not present

## 2024-01-05 NOTE — Progress Notes (Signed)
 Follow-Up Visit   Subjective  Stephen Prince is a 28 y.o. male who presents for the following: Excision of a neoplasm of skin on the upper central back, referred by Dr. Myrtie Atkinson.  The following portions of the chart were reviewed this encounter and updated as appropriate: medications, allergies, medical history  Review of Systems:  No other skin or systemic complaints except as noted in HPI or Assessment and Plan.  Objective  Well appearing patient in no apparent distress; mood and affect are within normal limits.  A focused examination was performed of the following areas: Upper central back Relevant physical exam findings are noted in the Assessment and Plan.   upper central back 3.5 cm soft subcutaneous cystic nodule    Assessment & Plan   NEOPLASM OF UNCERTAIN BEHAVIOR OF SKIN upper central back Skin excision  Excision method:  punch Lesion length (cm):  4 Lesion width (cm):  3.7 Margin per side (cm):  0.1 Total excision diameter (cm):  4.2 Informed consent: discussed and consent obtained   Timeout: patient name, date of birth, surgical site, and procedure verified   Procedure prep:  Patient was prepped and draped in usual sterile fashion Prep type:  Chlorhexidine Anesthesia: the lesion was anesthetized in a standard fashion   Anesthetic:  1% lidocaine  w/ epinephrine 1-100,000 buffered w/ 8.4% NaHCO3 Instrument used: #15 blade   Hemostasis achieved with: suture, pressure and electrodesiccation   Outcome: patient tolerated procedure well with no complications   Post-procedure details: sterile dressing applied and wound care instructions given   Dressing type: bandage and pressure dressing    Skin repair Complexity:  Complex Final length (cm):  6 Informed consent: discussed and consent obtained   Timeout: patient name, date of birth, surgical site, and procedure verified   Procedure prep:  Patient was prepped and draped in usual sterile fashion Prep type:   Chlorhexidine Anesthesia: the lesion was anesthetized in a standard fashion   Anesthetic:  1% lidocaine  w/ epinephrine 1-100,000 buffered w/ 8.4% NaHCO3 Reason for type of repair: preserve normal anatomy, preserve normal anatomical and functional relationships, avoid adjacent structures and allow side-to-side closure without requiring a flap or graft   Undermining: area extensively undermined   Subcutaneous layers (deep stitches):  Suture size:  3-0 Suture type: PDS (polydioxanone)   Stitches:  Buried vertical mattress Fine/surface layer approximation (top stitches):  Suture type: cyanoacrylate tissue glue   Hemostasis achieved with: suture, pressure and electrodesiccation Outcome: patient tolerated procedure well with no complications   Post-procedure details: sterile dressing applied and wound care instructions given   Dressing type: bandage and pressure dressing   Specimen 1 - Surgical pathology Differential Diagnosis: R/O cyst vs lipoma vs other  Check Margins: No  The surgical wound was then cleaned, prepped, and re-anesthetized as above. Wound edges were undermined extensively along at least one entire edge and at a distance equal to or greater than the width of the defect (see wound defect size above) in order to achieve closure and decrease wound tension and anatomic distortion. Redundant tissue repair including standing cone removal was performed. Hemostasis was achieved with electrocautery. Subcutaneous and epidermal tissues were approximated with the above sutures. The surgical site was then lightly scrubbed with sterile, saline-soaked gauze. Steri-strips were applied, and the area was then bandaged using Vaseline ointment, non-adherent gauze, gauze pads, and tape to provide an adequate pressure dressing. The patient tolerated the procedure well, was given detailed written and verbal wound care instructions, and was discharged  in good condition.   The patient will follow-up:  PRN.   Return if symptoms worsen or fail to improve.  I, Haig Levan, Surg Tech III, am acting as scribe for Deneise Finlay, MD.   Documentation: I have reviewed the above documentation for accuracy and completeness, and I agree with the above.  Deneise Finlay, MD

## 2024-01-05 NOTE — Patient Instructions (Signed)

## 2024-01-07 ENCOUNTER — Inpatient Hospital Stay (HOSPITAL_COMMUNITY)
Admission: EM | Admit: 2024-01-07 | Discharge: 2024-01-10 | DRG: 683 | Disposition: A | Discharge status: Home / Self Care | Attending: Internal Medicine | Admitting: Internal Medicine

## 2024-01-07 ENCOUNTER — Encounter (HOSPITAL_COMMUNITY): Payer: Self-pay

## 2024-01-07 ENCOUNTER — Inpatient Hospital Stay (HOSPITAL_COMMUNITY)
Admission: RE | Admit: 2024-01-07 | Discharge: 2024-01-07 | Disposition: A | Discharge status: Home / Self Care | Source: Ambulatory Visit | Attending: Nephrology | Admitting: Nephrology

## 2024-01-07 ENCOUNTER — Other Ambulatory Visit: Payer: Self-pay

## 2024-01-07 DIAGNOSIS — Z79899 Other long term (current) drug therapy: Secondary | ICD-10-CM

## 2024-01-07 DIAGNOSIS — E785 Hyperlipidemia, unspecified: Secondary | ICD-10-CM | POA: Diagnosis present

## 2024-01-07 DIAGNOSIS — Z833 Family history of diabetes mellitus: Secondary | ICD-10-CM

## 2024-01-07 DIAGNOSIS — E1022 Type 1 diabetes mellitus with diabetic chronic kidney disease: Secondary | ICD-10-CM | POA: Diagnosis present

## 2024-01-07 DIAGNOSIS — K920 Hematemesis: Secondary | ICD-10-CM | POA: Diagnosis present

## 2024-01-07 DIAGNOSIS — Z794 Long term (current) use of insulin: Secondary | ICD-10-CM

## 2024-01-07 DIAGNOSIS — D631 Anemia in chronic kidney disease: Secondary | ICD-10-CM | POA: Diagnosis present

## 2024-01-07 DIAGNOSIS — N179 Acute kidney failure, unspecified: Secondary | ICD-10-CM

## 2024-01-07 DIAGNOSIS — Z9641 Presence of insulin pump (external) (internal): Secondary | ICD-10-CM | POA: Diagnosis present

## 2024-01-07 DIAGNOSIS — Z8249 Family history of ischemic heart disease and other diseases of the circulatory system: Secondary | ICD-10-CM

## 2024-01-07 DIAGNOSIS — N189 Chronic kidney disease, unspecified: Secondary | ICD-10-CM | POA: Diagnosis present

## 2024-01-07 DIAGNOSIS — I120 Hypertensive chronic kidney disease with stage 5 chronic kidney disease or end stage renal disease: Secondary | ICD-10-CM | POA: Diagnosis present

## 2024-01-07 DIAGNOSIS — N185 Chronic kidney disease, stage 5: Secondary | ICD-10-CM | POA: Diagnosis present

## 2024-01-07 HISTORY — DX: Acute kidney failure, unspecified: N18.9

## 2024-01-07 HISTORY — DX: Acute kidney failure, unspecified: N17.9

## 2024-01-07 LAB — URINALYSIS, ROUTINE W REFLEX MICROSCOPIC
Bacteria, UA: NONE SEEN
Bilirubin Urine: NEGATIVE
Glucose, UA: 50 mg/dL — AB
Ketones, ur: NEGATIVE mg/dL
Leukocytes,Ua: NEGATIVE
Nitrite: NEGATIVE
Protein, ur: >=300 mg/dL — AB
Specific Gravity, Urine: 1.008 (ref 1.005–1.030)
pH: 6 (ref 5.0–8.0)

## 2024-01-07 LAB — CBC WITH DIFFERENTIAL/PLATELET
Abs Immature Granulocytes: 0.02 10*3/uL (ref 0.00–0.07)
Basophils Absolute: 0 10*3/uL (ref 0.0–0.1)
Basophils Relative: 1 %
Eosinophils Absolute: 0.2 10*3/uL (ref 0.0–0.5)
Eosinophils Relative: 3 %
HCT: 27.6 % — ABNORMAL LOW (ref 39.0–52.0)
Hemoglobin: 9 g/dL — ABNORMAL LOW (ref 13.0–17.0)
Immature Granulocytes: 0 %
Lymphocytes Relative: 20 %
Lymphs Abs: 1.4 10*3/uL (ref 0.7–4.0)
MCH: 27.4 pg (ref 26.0–34.0)
MCHC: 32.6 g/dL (ref 30.0–36.0)
MCV: 84.1 fL (ref 80.0–100.0)
Monocytes Absolute: 0.5 10*3/uL (ref 0.1–1.0)
Monocytes Relative: 7 %
Neutro Abs: 4.9 10*3/uL (ref 1.7–7.7)
Neutrophils Relative %: 69 %
Platelets: 313 10*3/uL (ref 150–400)
RBC: 3.28 MIL/uL — ABNORMAL LOW (ref 4.22–5.81)
RDW: 12.2 % (ref 11.5–15.5)
WBC: 7.1 10*3/uL (ref 4.0–10.5)
nRBC: 0 % (ref 0.0–0.2)

## 2024-01-07 LAB — PROTIME-INR
INR: 1.1 (ref 0.8–1.2)
Prothrombin Time: 14.5 s (ref 11.4–15.2)

## 2024-01-07 LAB — COMPREHENSIVE METABOLIC PANEL WITH GFR
ALT: 17 U/L (ref 0–44)
AST: 16 U/L (ref 15–41)
Albumin: 3.2 g/dL — ABNORMAL LOW (ref 3.5–5.0)
Alkaline Phosphatase: 73 U/L (ref 38–126)
Anion gap: 14 (ref 5–15)
BUN: 63 mg/dL — ABNORMAL HIGH (ref 6–20)
CO2: 19 mmol/L — ABNORMAL LOW (ref 22–32)
Calcium: 7.6 mg/dL — ABNORMAL LOW (ref 8.9–10.3)
Chloride: 102 mmol/L (ref 98–111)
Creatinine, Ser: 7.33 mg/dL — ABNORMAL HIGH (ref 0.61–1.24)
GFR, Estimated: 10 mL/min — ABNORMAL LOW (ref >60)
Glucose, Bld: 127 mg/dL — ABNORMAL HIGH (ref 70–99)
Potassium: 3.3 mmol/L — ABNORMAL LOW (ref 3.5–5.1)
Sodium: 135 mmol/L (ref 135–145)
Total Bilirubin: 0.2 mg/dL (ref 0.0–1.2)
Total Protein: 6.5 g/dL (ref 6.5–8.1)

## 2024-01-07 LAB — GLUCOSE, CAPILLARY
Glucose-Capillary: 133 mg/dL — ABNORMAL HIGH (ref 70–99)
Glucose-Capillary: 194 mg/dL — ABNORMAL HIGH (ref 70–99)

## 2024-01-07 LAB — ABO/RH: ABO/RH(D): A NEG

## 2024-01-07 LAB — TYPE AND SCREEN
ABO/RH(D): A NEG
Antibody Screen: NEGATIVE

## 2024-01-07 LAB — APTT: aPTT: 31 s (ref 24–36)

## 2024-01-07 LAB — OCCULT BLOOD X 1 CARD TO LAB, STOOL: Fecal Occult Bld: NEGATIVE

## 2024-01-07 MED ORDER — ALBUTEROL SULFATE (2.5 MG/3ML) 0.083% IN NEBU: 2.5000 mg | INHALATION_SOLUTION | RESPIRATORY_TRACT | Status: DC | PRN

## 2024-01-07 MED ORDER — HEPARIN SODIUM (PORCINE) 5000 UNIT/ML IJ SOLN
5000.0000 [IU] | Freq: Three times a day (TID) | INTRAMUSCULAR | Status: DC
  Filled 2024-01-07: qty 1

## 2024-01-07 MED ORDER — ROSUVASTATIN CALCIUM 10 MG PO TABS
20.0000 mg | ORAL_TABLET | Freq: Every day | ORAL | Status: DC
  Administered 2024-01-07 – 2024-01-09 (×3): 20 mg via ORAL
  Filled 2024-01-07 (×3): qty 2

## 2024-01-07 MED ORDER — ONDANSETRON HCL 4 MG PO TABS: 4.0000 mg | ORAL_TABLET | Freq: Four times a day (QID) | ORAL | Status: DC | PRN

## 2024-01-07 MED ORDER — SERTRALINE HCL 50 MG PO TABS
150.0000 mg | ORAL_TABLET | Freq: Every day | ORAL | Status: DC
  Administered 2024-01-08 – 2024-01-10 (×3): 150 mg via ORAL
  Filled 2024-01-07 (×3): qty 3

## 2024-01-07 MED ORDER — ACETAMINOPHEN 325 MG PO TABS
650.0000 mg | ORAL_TABLET | Freq: Four times a day (QID) | ORAL | Status: DC | PRN
  Administered 2024-01-08 – 2024-01-09 (×2): 650 mg via ORAL
  Filled 2024-01-07 (×2): qty 2

## 2024-01-07 MED ORDER — SODIUM CHLORIDE 0.9 % IV SOLN: INTRAVENOUS | Status: DC

## 2024-01-07 MED ORDER — PANTOPRAZOLE SODIUM 40 MG IV SOLR
40.0000 mg | Freq: Once | INTRAVENOUS | Status: AC
  Administered 2024-01-07: 40 mg via INTRAVENOUS
  Filled 2024-01-07: qty 10

## 2024-01-07 MED ORDER — AMLODIPINE BESYLATE 5 MG PO TABS
5.0000 mg | ORAL_TABLET | Freq: Every day | ORAL | Status: DC
  Administered 2024-01-08: 5 mg via ORAL
  Filled 2024-01-07: qty 1

## 2024-01-07 MED ORDER — LAMOTRIGINE 25 MG PO TABS
25.0000 mg | ORAL_TABLET | Freq: Two times a day (BID) | ORAL | Status: DC
  Administered 2024-01-07 – 2024-01-10 (×6): 25 mg via ORAL
  Filled 2024-01-07 (×6): qty 1

## 2024-01-07 MED ORDER — INSULIN PUMP
Freq: Three times a day (TID) | SUBCUTANEOUS | Status: DC
  Administered 2024-01-07: 2 via SUBCUTANEOUS
  Filled 2024-01-07: qty 1

## 2024-01-07 MED ORDER — ACETAMINOPHEN 650 MG RE SUPP: 650.0000 mg | Freq: Four times a day (QID) | RECTAL | Status: DC | PRN

## 2024-01-07 MED ORDER — TRAZODONE HCL 50 MG PO TABS: 25.0000 mg | ORAL_TABLET | Freq: Every evening | ORAL | Status: DC | PRN

## 2024-01-07 MED ORDER — ONDANSETRON HCL 4 MG/2ML IJ SOLN: 4.0000 mg | Freq: Once | INTRAMUSCULAR | Status: DC

## 2024-01-07 MED ORDER — ONDANSETRON HCL 4 MG/2ML IJ SOLN
4.0000 mg | Freq: Four times a day (QID) | INTRAMUSCULAR | Status: DC | PRN
  Administered 2024-01-08 – 2024-01-10 (×2): 4 mg via INTRAVENOUS
  Filled 2024-01-07 (×2): qty 2

## 2024-01-07 NOTE — Inpatient Diabetes Management (Signed)
 Inpatient Diabetes Program Recommendations  AACE/ADA: New Consensus Statement on Inpatient Glycemic Control (2015)  Target Ranges:  Prepandial:   less than 140 mg/dL      Peak postprandial:   less than 180 mg/dL (1-2 hours)      Critically ill patients:  140 - 180 mg/dL   Lab Results  Component Value Date   GLUCAP 163 (H) 07/09/2023   HGBA1C 7.4 (H) 05/12/2023    Review of Glycemic Control  Diabetes history: T1DM Outpatient Diabetes medications:  T-Slim insulin  pump with the Dexcom G7 Basal rate-0.45/hr (total 10.8 units daily) Insulin  carb ratio-1:12 Insulin  sensitivity factor-40 Target BG-110  Current orders for Inpatient glycemic control:  Insulin  pump therapy  Met with patient and family at bedside.  Reviewed insulin  pump settings.  He is A&O to manage his insulin  pump.  Endo is Delfin Fell; he has an appointment scheduled with her within a month.  He has an appointment with [facility] next week for a kidney transplant work-up.  Please print insulin  pump therapy contract and flowsheet.    Will continue to follow while inpatient.  Thank you, Hays Lipschutz, MSN, CDCES Diabetes Coordinator Inpatient Diabetes Program [phone] (team pager from 8a-5p)

## 2024-01-07 NOTE — ED Triage Notes (Signed)
 Pt arrived with EMS reporting one episode of vomiting this morning with blood. Pt unsure if it was bright red blood. Reports "I was still sleepy". Denies abdominal pain or any other discomfort at this time. Pt AAOX4. NAD noted.

## 2024-01-07 NOTE — Plan of Care (Signed)
  Problem: Education: Goal: Knowledge of General Education information will improve Description: Including pain rating scale, medication(s)/side effects and non-pharmacologic comfort measures Outcome: Progressing   Problem: Clinical Measurements: Goal: Will remain free from infection Outcome: Progressing   Problem: Safety: Goal: Ability to remain free from injury will improve Outcome: Progressing   Problem: Fluid Volume: Goal: Compliance with measures to maintain balanced fluid volume will improve Outcome: Progressing   Problem: Clinical Measurements: Goal: Complications related to the disease process, condition or treatment will be avoided or minimized Outcome: Progressing

## 2024-01-07 NOTE — H&P (Addendum)
 History and Physical  [name] GNF:[id number] DOB: 10-26-1995 DOA: 01/07/2024  PCP: Roselind Congo, MD   Chief Complaint: Vomiting  HPI: Stephen Prince is a 28 y.o. male with medical history significant for type 1 diabetes with insulin  pump, uncontrolled hypertension, progressive CKD presented to the ER with complaints of hematemesis and found to have acute on chronic renal failure.  Patient states that he has been in his usual state of health, his nephrologist Dr. Zelda Hickman has been gradually adjusting his medications, but they are in preparations for starting hemodialysis, potentially PD he is also being initiating evaluation for kidney transplant.  He is on daily Lasix due to lower extremity edema which is gradually improving, however losartan  was discontinued a few weeks ago.  He states that occasionally he gets choked up on his medications and has emesis, this is what happened today.  His emesis looked a little bit dark so he was worried that it was potentially some bleeding.  Review of Systems: Please see HPI for pertinent positives and negatives. A complete 10 system review of systems are otherwise negative.  Past Medical History:  Diagnosis Date   Chronic kidney disease    Diabetes mellitus without complication (HCC)    History reviewed. No pertinent surgical history. Social History:  reports that he has never smoked. He has never used smokeless tobacco. He reports that he does not drink alcohol and does not use drugs.  No Known Allergies  Family History  Problem Relation Age of Onset   Diabetes Mother    Hypertension Father      Prior to Admission medications   Medication Sig Start Date End Date Taking? Authorizing Provider  amLODipine  (NORVASC ) 5 MG tablet Take 5 mg by mouth daily. 10/29/23   [provider]  clindamycin  (CLEOCIN  T) 1 % SWAB Apply 1 Application topically in the morning. 12/09/23   Dellar Fenton, DO  Dextromethorphan-Guaifenesin 60-1200 MG 12hr  tablet Take 1 tablet by mouth daily.    [provider]  DM-Doxylamine-Acetaminophen  (NYQUIL COLD & FLU PO) Take 30 mLs by mouth at bedtime as needed (For sinusitis).    [provider]  doxycycline  (VIBRAMYCIN ) 100 MG capsule Take 1 capsule (100 mg total) by mouth 2 (two) times daily. Finish your course as you were doing, if you have already finished your course then no changes. 05/14/23   Singh, Prashant K, MD  furosemide (LASIX) 40 MG tablet Take 40 mg by mouth daily. 09/30/23   [provider]  Glucagon (GVOKE HYPOPEN 2-PACK) 1 MG/0.2ML SOAJ Inject 1 pen  into the skin once as needed (For severe hypoglycemia). 05/20/23   [provider]  insulin  aspart (NOVOLOG ) 100 UNIT/ML injection Inject into the skin continuous. Uses in insulin  pump    [provider]  lamoTRIgine  (LAMICTAL ) 25 MG tablet Take 2 tablets twice a day 11/05/23   Jhonny Moss, MD  losartan  (COZAAR ) 100 MG tablet Take 0.5 tablets (50 mg total) by mouth daily. Patient taking differently: Take 100 mg by mouth at bedtime. 05/15/23   Singh, Prashant K, MD  ondansetron  (ZOFRAN ) 4 MG tablet Take 1 tablet (4 mg total) by mouth every 6 (six) hours. 07/09/23   Robinson, John K, PA-C  rosuvastatin  (CRESTOR ) 20 MG tablet Take 20 mg by mouth at bedtime.    [provider]  sertraline  (ZOLOFT ) 100 MG tablet Take 150 mg by mouth daily.    [provider]  tretinoin  (RETIN-A ) 0.025 % cream Apply  topically at bedtime. 12/09/23 12/08/24  Dellar Fenton, DO  Vitamin D, Ergocalciferol, (DRISDOL) 1.25 MG (50000 UNIT) CAPS capsule Take 50,000 Units by mouth once a week.    [provider]    Physical Exam: BP (!) 150/95   Pulse 85   Temp 97.8 F (36.6 C) (Oral)   Resp 19   Ht 5\' 7"  (1.702 m)   Wt 72.8 kg   SpO2 93%   BMI 25.14 kg/m  General:  Alert, oriented, calm, in no acute distress, his father is at the bedside Eyes: EOMI, clear conjuctivae, white sclerea Neck:  supple, no masses, trachea mildline  Cardiovascular: RRR, no murmurs or rubs, he has 1+ pitting bilateral lower extremity edema  Respiratory: clear to auscultation bilaterally, no wheezes, no crackles  Abdomen: soft, nontender, nondistended, normal bowel tones heard  Skin: dry, no rashes  Musculoskeletal: no joint effusions, normal range of motion  Psychiatric: appropriate affect, normal speech  Neurologic: extraocular muscles intact, clear speech, moving all extremities with intact sensorium         Labs on Admission:  Basic Metabolic Panel: Recent Labs  Lab 01/07/24 0838  NA 135  K 3.3*  CL 102  CO2 19*  GLUCOSE 127*  BUN 63*  CREATININE 7.33*  CALCIUM  7.6*   Liver Function Tests: Recent Labs  Lab 01/07/24 0838  AST 16  ALT 17  ALKPHOS 73  BILITOT 0.2  PROT 6.5  ALBUMIN 3.2*   No results for input(s): "LIPASE", "AMYLASE" in the last 168 hours. No results for input(s): "AMMONIA" in the last 168 hours. CBC: Recent Labs  Lab 01/07/24 0838  WBC 7.1  NEUTROABS 4.9  HGB 9.0*  HCT 27.6*  MCV 84.1  PLT 313   Cardiac Enzymes: No results for input(s): "CKTOTAL", "CKMB", "CKMBINDEX", "TROPONINI" in the last 168 hours. BNP (last 3 results) No results for input(s): "BNP" in the last 8760 hours.  ProBNP (last 3 results) No results for input(s): "PROBNP" in the last 8760 hours.  CBG: No results for input(s): "GLUCAP" in the last 168 hours.  Radiological Exams on Admission: No results found. Assessment/Plan DAVIONE LENKER is a 28 y.o. male with medical history significant for type 1 diabetes with insulin  pump, uncontrolled hypertension, progressive CKD presented to the ER with complaints of hematemesis and found to have acute on chronic renal failure.   Acute renal failure superimposed on CKD, he is approaching ESRD-this may be AKI, versus progression of his known CKD.  Most recent labs I am able to review from 10/2023 demonstrate creatinine of 5.76.  Patient now  significant volume overloaded, electrolytes are stable with no indication for urgent dialysis. -Observation admission -Continue to hold ARB, diuretics, etc. -Will hydrate gently with normal saline -Discussed with nephrology Dr. Edson Graces, who recommends hydration and outpatient follow-up with Dr. Zelda Hickman if we can get his creatinine back to what we presume to be his baseline of a creatinine around 6.  If creatinine does not improve, then he would likely benefit from formal inpatient nephrology consultation and consideration of dialysis  Type 1 diabetes-carb modified diet, patient to manage insulin  pump  Chronic anemia-related to his CKD, unclear whether this is true acute anemia or sequela of his known chronic anemia -Avoid blood thinners -Trend hemoglobin  Hypertension-amlodipine   Hyperlipidemia-Crestor   DVT prophylaxis: SCDs only    Code Status: Full Code  Consults called: Discussed with Dr. Edson Graces as above  Admission status: Observation  Time spent: 59 minutes  Massiel Stipp Rickey Charm MD  [facility] Pager [phone]  If 7PM-7AM, please contact night-coverage [email or web address] Password [email or web address]  01/07/2024, 1:32 PM

## 2024-01-07 NOTE — ED Notes (Signed)
 Provide pt with a urinal

## 2024-01-07 NOTE — ED Provider Notes (Signed)
 [place] EMERGENCY DEPARTMENT AT [facility] Provider Note   CSN: [id number] Arrival date & time: 01/07/24  2440     History  Chief Complaint  Patient presents with   Emesis    Stephen Prince is a 28 y.o. male with diabetes, seizures, CKD who presents via EMS reporting one episode of vomiting this morning with blood, dark red. Denies abdominal pain or any other discomfort at this time. Otherwise was in his normal state of health. Was supposed to go for an iron infusion this morning for anemia of chronic disease in s/o CKD stage 4. No h/o acid reflux or gastritis or PUD. No GERD sxs. No chest pain, abdominal pain, hematochezia/melena. Doesn't take NSAIDs or drink alcohol/smoke tobacco. No blood thinners. Didn't receive meds w/ EMS. Patient does report he has had a toothache recently so hasn't been eating as much but has been trying to drink and eat well.   Past Medical History:  Diagnosis Date   Chronic kidney disease    Diabetes mellitus without complication (HCC)        Home Medications Prior to Admission medications   Medication Sig Start Date End Date Taking? Authorizing Provider  amLODipine  (NORVASC ) 5 MG tablet Take 5 mg by mouth daily. 10/29/23   [provider]  clindamycin  (CLEOCIN  T) 1 % SWAB Apply 1 Application topically in the morning. 12/09/23   Dellar Fenton, DO  Dextromethorphan-Guaifenesin 60-1200 MG 12hr tablet Take 1 tablet by mouth daily.    [provider]  DM-Doxylamine-Acetaminophen  (NYQUIL COLD & FLU PO) Take 30 mLs by mouth at bedtime as needed (For sinusitis).    [provider]  doxycycline  (VIBRAMYCIN ) 100 MG capsule Take 1 capsule (100 mg total) by mouth 2 (two) times daily. Finish your course as you were doing, if you have already finished your course then no changes. 05/14/23   Singh, Prashant K, MD  furosemide (LASIX) 40 MG tablet Take 40 mg by mouth daily. 09/30/23   [provider]  Glucagon (GVOKE  HYPOPEN 2-PACK) 1 MG/0.2ML SOAJ Inject 1 pen  into the skin once as needed (For severe hypoglycemia). 05/20/23   [provider]  insulin  aspart (NOVOLOG ) 100 UNIT/ML injection Inject into the skin continuous. Uses in insulin  pump    [provider]  lamoTRIgine  (LAMICTAL ) 25 MG tablet Take 2 tablets twice a day 11/05/23   Jhonny Moss, MD  losartan  (COZAAR ) 100 MG tablet Take 0.5 tablets (50 mg total) by mouth daily. Patient taking differently: Take 100 mg by mouth at bedtime. 05/15/23   Singh, Prashant K, MD  ondansetron  (ZOFRAN ) 4 MG tablet Take 1 tablet (4 mg total) by mouth every 6 (six) hours. 07/09/23   Robinson, John K, PA-C  rosuvastatin  (CRESTOR ) 20 MG tablet Take 20 mg by mouth at bedtime.    [provider]  sertraline  (ZOLOFT ) 100 MG tablet Take 150 mg by mouth daily.    [provider]  tretinoin  (RETIN-A ) 0.025 % cream Apply topically at bedtime. 12/09/23 12/08/24  Dellar Fenton, DO  Vitamin D, Ergocalciferol, (DRISDOL) 1.25 MG (50000 UNIT) CAPS capsule Take 50,000 Units by mouth once a week.    [provider]      Allergies    Patient has no known allergies.    Review of Systems   Review of Systems A 10 point review of systems was performed and is negative unless otherwise reported in HPI.  Physical Exam Updated Vital Signs BP Aaron Aas)  171/107 (BP Location: Right Arm)   Pulse 93   Temp 98.1 F (36.7 C) (Oral)   Resp 16   Ht 5\' 7"  (1.702 m)   Wt 72.8 kg   SpO2 98%   BMI 25.14 kg/m  Physical Exam General: Normal appearing male, lying in bed.  HEENT: PERRLA, Sclera anicteric, MMM, trachea midline.  Cardiology: RRR, no murmurs/rubs/gallops.  Resp: Normal respiratory rate and effort. CTAB, no wheezes, rhonchi, crackles.  Abd: Soft, non-tender, non-distended. No rebound tenderness or guarding.  GU: Deferred. MSK: No peripheral edema or signs of trauma. Extremities without deformity or TTP. No cyanosis or clubbing. Skin: warm,  dry.  Back: No CVA tenderness Neuro: A&Ox4, CNs II-XII grossly intact. MAEs. Sensation grossly intact.  Psych: Normal mood and affect.   ED Results / Procedures / Treatments   Labs (all labs ordered are listed, but only abnormal results are displayed) Labs Reviewed - No data to display  EKG None  Radiology No results found.  Procedures Procedures    Medications Ordered in ED Medications - No data to display  ED Course/ Medical Decision Making/ A&P                          Medical Decision Making Amount and/or Complexity of Data Reviewed Labs: ordered. Decision-making details documented in ED Course.  Risk Prescription drug management. Decision regarding hospitalization.    This patient presents to the ED for concern of hematemesis x1, this involves an extensive number of treatment options, and is a complaint that carries with it a high risk of complications and morbidity.  I considered the following differential and admission for this acute, potentially life threatening condition. Pt is hypertensive in to 170s/100s systolic but otherwise very well appearing, afebrile.  MDM:    Patient with hematemesis. No h/o liver disease/varices. Reports no h/o GERD sxs. Consider gastritis/PUD. Hadn't been repeatedly retching, no prior N/V before this, no CP to indicate mallory weiss tear or boerhaave's syndrome. No report of hematochezia/melena. No prior dyspepsia/epigastric pain. Does have mild 2 point decrease in Hgb from six months ago to 9.0, but patient has known ACD and reports only one episode of hematemesis. Overall lower c/f ABLA but will be able to recheck Hgb.   Pt w/ AKI in w/o T1DM. Has appt with [facility] coming up for preliminary education regarding kidney transplant. Not a dialysis patient. Patient with worsening AKI on CKD nearly doubled to 7.33. Consider prerenal w/ possibly ?decreased PO intake. No abd pain or flank pain, no urinary retention to suggest obstructive cause. No  new meds to indicate ATN/nephrotoxins. Discussed with patient and his father. Will be admitted to medicine.  Clinical Course as of 01/07/24 1357  Fri Jan 07, 2024  0913 Hemoglobin(!): 9.0 Down from 11.3 six months ago [HN]  1130 Creatinine(!): 7.33 Significantly elevated AKI on CKD. BL 3-4. [HN]    Clinical Course User Index [HN] Merdis Stalling, MD     Labs: I Ordered, and personally interpreted labs.  The pertinent results include:  those listed above  Additional history obtained from chart review, father at bedside.    Cardiac Monitoring: The patient was maintained on a cardiac monitor.  I personally viewed and interpreted the cardiac monitored which showed an underlying rhythm of: NSR  Reevaluation: After the interventions noted above, I reevaluated the patient and found that they have :improved  Social Determinants of Health: Lives independently  Disposition:  DC w/ discharge instructions/return precautions.  All questions answered to patient's satisfaction.    Co morbidities that complicate the patient evaluation  Past Medical History:  Diagnosis Date   Chronic kidney disease    Diabetes mellitus without complication (HCC)      Medicines No orders of the defined types were placed in this encounter.   I have reviewed the patients home medicines and have made adjustments as needed  Problem List / ED Course: Problem List Items Addressed This Visit       Genitourinary   * (Principal) AKI (acute kidney injury) (HCC) - Primary   Other Visit Diagnoses       Hematemesis with nausea                       This note was created using dictation software, which may contain spelling or grammatical errors.    Merdis Stalling, MD 01/07/24 [phone]

## 2024-01-08 DIAGNOSIS — Z79899 Other long term (current) drug therapy: Secondary | ICD-10-CM | POA: Diagnosis not present

## 2024-01-08 DIAGNOSIS — Z833 Family history of diabetes mellitus: Secondary | ICD-10-CM | POA: Diagnosis not present

## 2024-01-08 DIAGNOSIS — N185 Chronic kidney disease, stage 5: Secondary | ICD-10-CM | POA: Diagnosis present

## 2024-01-08 DIAGNOSIS — Z9641 Presence of insulin pump (external) (internal): Secondary | ICD-10-CM | POA: Diagnosis present

## 2024-01-08 DIAGNOSIS — D631 Anemia in chronic kidney disease: Secondary | ICD-10-CM | POA: Diagnosis present

## 2024-01-08 DIAGNOSIS — Z8249 Family history of ischemic heart disease and other diseases of the circulatory system: Secondary | ICD-10-CM | POA: Diagnosis not present

## 2024-01-08 DIAGNOSIS — I120 Hypertensive chronic kidney disease with stage 5 chronic kidney disease or end stage renal disease: Secondary | ICD-10-CM | POA: Diagnosis present

## 2024-01-08 DIAGNOSIS — N179 Acute kidney failure, unspecified: Secondary | ICD-10-CM | POA: Diagnosis present

## 2024-01-08 DIAGNOSIS — E1022 Type 1 diabetes mellitus with diabetic chronic kidney disease: Secondary | ICD-10-CM | POA: Diagnosis present

## 2024-01-08 DIAGNOSIS — K920 Hematemesis: Secondary | ICD-10-CM | POA: Diagnosis present

## 2024-01-08 DIAGNOSIS — E785 Hyperlipidemia, unspecified: Secondary | ICD-10-CM | POA: Diagnosis present

## 2024-01-08 DIAGNOSIS — Z794 Long term (current) use of insulin: Secondary | ICD-10-CM | POA: Diagnosis not present

## 2024-01-08 LAB — GLUCOSE, CAPILLARY
Glucose-Capillary: 103 mg/dL — ABNORMAL HIGH (ref 70–99)
Glucose-Capillary: 102 mg/dL — ABNORMAL HIGH (ref 70–99)
Glucose-Capillary: 108 mg/dL — ABNORMAL HIGH (ref 70–99)
Glucose-Capillary: 114 mg/dL — ABNORMAL HIGH (ref 70–99)
Glucose-Capillary: 130 mg/dL — ABNORMAL HIGH (ref 70–99)

## 2024-01-08 LAB — BASIC METABOLIC PANEL WITH GFR
Anion gap: 14 (ref 5–15)
BUN: 57 mg/dL — ABNORMAL HIGH (ref 6–20)
CO2: 19 mmol/L — ABNORMAL LOW (ref 22–32)
Calcium: 7.5 mg/dL — ABNORMAL LOW (ref 8.9–10.3)
Chloride: 107 mmol/L (ref 98–111)
Creatinine, Ser: 7.29 mg/dL — ABNORMAL HIGH (ref 0.61–1.24)
GFR, Estimated: 10 mL/min — ABNORMAL LOW (ref >60)
Glucose, Bld: 110 mg/dL — ABNORMAL HIGH (ref 70–99)
Potassium: 3.5 mmol/L (ref 3.5–5.1)
Sodium: 140 mmol/L (ref 135–145)

## 2024-01-08 LAB — CBC
HCT: 27.6 % — ABNORMAL LOW (ref 39.0–52.0)
Hemoglobin: 8.8 g/dL — ABNORMAL LOW (ref 13.0–17.0)
MCH: 27.9 pg (ref 26.0–34.0)
MCHC: 31.9 g/dL (ref 30.0–36.0)
MCV: 87.6 fL (ref 80.0–100.0)
Platelets: 292 10*3/uL (ref 150–400)
RBC: 3.15 MIL/uL — ABNORMAL LOW (ref 4.22–5.81)
RDW: 12.2 % (ref 11.5–15.5)
WBC: 8.5 10*3/uL (ref 4.0–10.5)
nRBC: 0 % (ref 0.0–0.2)

## 2024-01-08 MED ORDER — CARVEDILOL 3.125 MG PO TABS
3.1250 mg | ORAL_TABLET | Freq: Two times a day (BID) | ORAL | Status: DC
  Administered 2024-01-08 – 2024-01-09 (×4): 3.125 mg via ORAL
  Filled 2024-01-08 (×4): qty 1

## 2024-01-08 MED ORDER — HYDRALAZINE HCL 20 MG/ML IJ SOLN
10.0000 mg | INTRAMUSCULAR | Status: DC | PRN
  Administered 2024-01-10 (×2): 10 mg via INTRAVENOUS
  Filled 2024-01-08 (×2): qty 1

## 2024-01-08 MED ORDER — CALCITRIOL 0.25 MCG PO CAPS
0.2500 ug | ORAL_CAPSULE | Freq: Every day | ORAL | Status: DC
  Administered 2024-01-08 – 2024-01-10 (×3): 0.25 ug via ORAL
  Filled 2024-01-08 (×3): qty 1

## 2024-01-08 MED ORDER — METOPROLOL TARTRATE 5 MG/5ML IV SOLN: 5.0000 mg | INTRAVENOUS | Status: DC | PRN

## 2024-01-08 MED ORDER — SODIUM CHLORIDE 0.9 % IV SOLN: INTRAVENOUS | Status: AC

## 2024-01-08 MED ORDER — SENNOSIDES-DOCUSATE SODIUM 8.6-50 MG PO TABS: 1.0000 | ORAL_TABLET | Freq: Every evening | ORAL | Status: DC | PRN

## 2024-01-08 MED ORDER — OXYCODONE HCL 5 MG PO TABS
5.0000 mg | ORAL_TABLET | Freq: Once | ORAL | Status: AC
  Administered 2024-01-08: 5 mg via ORAL
  Filled 2024-01-08: qty 1

## 2024-01-08 MED ORDER — IPRATROPIUM-ALBUTEROL 0.5-2.5 (3) MG/3ML IN SOLN: 3.0000 mL | RESPIRATORY_TRACT | Status: DC | PRN

## 2024-01-08 MED ORDER — SODIUM CHLORIDE 0.9 % IV SOLN: INTRAVENOUS | Status: DC

## 2024-01-08 MED ORDER — GUAIFENESIN 100 MG/5ML PO LIQD: 5.0000 mL | ORAL | Status: DC | PRN

## 2024-01-08 NOTE — Progress Notes (Signed)
 PROGRESS NOTE    Stephen Prince  ZOX:[id number] DOB: 1996-09-13 DOA: 01/07/2024 PCP: Roselind Congo, MD    Brief Narrative:  28 year old with history of DM1 with insulin  pump, uncontrolled HTN, progressive CKD admitted to the hospital for hematemesis and acute on chronic renal failure.  His outpatient nephrologist Dr. Zelda Hickman has been adjusting his medications accordingly in preparation to start him on hemodialysis and possibly also renal transplant evaluation.  Admission creatinine 7.33.   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  Admission creatinine 7.33, February 2025 was 5.76.  Creatinine still remains elevated 7.29, continue IV fluids - Currently holding any nephrotoxic drugs.  Admitting provider discussed case with Dr. Edson Graces from nephrology who recommends hydrating the patient and eventually discharging him if renal function improves otherwise they will officially consult and consider initiating dialysis during this hospitalization.   Type 1 diabetes; on insulin  pump -Carb modified diet.  Insulin  is currently managed by patient via his insulin  pump.  Patient would like to manage his own during the hospitalization. -Follows outpatient endocrinology  Recurrent convulsions - Recently seen by outpatient neurology.  Currently on Lamictal    Anemia of chronic disease -Hemoglobin around baseline 9.0   Hypertension - Coreg, amlodipine , IV as needed   Hyperlipidemia -Crestor   DVT prophylaxis: SCDs    Code Status: Full Code Family Communication: Father at bedside Continue hospital stay for management of worsening renal function    Subjective:  Patient reports he has been making urine and overall his diabetes is better controlled.  Examination:  General exam: Appears calm and comfortable  Respiratory system:  Clear to auscultation. Respiratory effort normal. Cardiovascular system: S1 & S2 heard, RRR. No JVD, murmurs, rubs, gallops or clicks. No pedal edema. Gastrointestinal system: Abdomen is nondistended, soft and nontender. No organomegaly or masses felt. Normal bowel sounds heard. Central nervous system: Alert and oriented. No focal neurological deficits. Extremities: Symmetric 5 x 5 power. Skin: No rashes, lesions or ulcers Psychiatry: Judgement and insight appear normal. Mood & affect appropriate.                Diet Orders (From admission, onward)     Start     Ordered   01/07/24 1352  Diet Carb Modified Fluid consistency: Thin  Diet effective now       Question Answer Comment  Calorie Level Medium 1600-2000   Fluid consistency: Thin      01/07/24 1351            Objective: Vitals:   01/07/24 1801 01/07/24 2030 01/07/24 2031 01/08/24 0323  BP: (!) 160/102 (!) 163/102 (!) 162/97 (!) 151/96  Pulse: 94 94 93 96  Resp:  18  18  Temp: 98.4 F (36.9 C) 98.1 F (36.7 C)  98.1 F (36.7 C)  TempSrc:  Oral  Oral  SpO2: 93% 96% 96% (!) 87%  Weight:      Height:        Intake/Output Summary (Last 24 hours) at 01/08/2024 1015 Last data filed at 01/07/2024 1800 Gross per 24 hour  Intake 698.33 ml  Output --  Net 698.33 ml   Filed Weights   01/07/24 0803  Weight: 72.8 kg    Scheduled Meds:  amLODipine   5 mg Oral Daily   calcitRIOL  0.25 mcg Oral Daily   carvedilol  3.125  mg Oral BID   insulin  pump   Subcutaneous TID WC, HS, 0200   lamoTRIgine   25 mg Oral BID   ondansetron  (ZOFRAN ) IV  4 mg Intravenous Once   rosuvastatin   20 mg Oral QHS   sertraline   150 mg Oral Daily   Continuous Infusions:  sodium chloride  125 mL/hr at 01/08/24 0913    Nutritional status     Body mass index is 25.14 kg/m.  Data Reviewed:   CBC: Recent Labs  Lab 01/07/24 0838 01/08/24 0632  WBC 7.1 8.5  NEUTROABS 4.9  --   HGB 9.0* 8.8*  HCT 27.6* 27.6*  MCV 84.1 87.6   PLT 313 292   Basic Metabolic Panel: Recent Labs  Lab 01/07/24 0838 01/08/24 0632  NA 135 140  K 3.3* 3.5  CL 102 107  CO2 19* 19*  GLUCOSE 127* 110*  BUN 63* 57*  CREATININE 7.33* 7.29*  CALCIUM  7.6* 7.5*   GFR: Estimated Creatinine Clearance: 14.4 mL/min (A) (by C-G formula based on SCr of 7.29 mg/dL (H)). Liver Function Tests: Recent Labs  Lab 01/07/24 0838  AST 16  ALT 17  ALKPHOS 73  BILITOT 0.2  PROT 6.5  ALBUMIN 3.2*   No results for input(s): "LIPASE", "AMYLASE" in the last 168 hours. No results for input(s): "AMMONIA" in the last 168 hours. Coagulation Profile: Recent Labs  Lab 01/07/24 1326  INR 1.1   Cardiac Enzymes: No results for input(s): "CKTOTAL", "CKMB", "CKMBINDEX", "TROPONINI" in the last 168 hours. BNP (last 3 results) No results for input(s): "PROBNP" in the last 8760 hours. HbA1C: No results for input(s): "HGBA1C" in the last 72 hours. CBG: Recent Labs  Lab 01/07/24 1652 01/07/24 2118 01/08/24 0452 01/08/24 0742  GLUCAP 133* 194* 103* 102*   Lipid Profile: No results for input(s): "CHOL", "HDL", "LDLCALC", "TRIG", "CHOLHDL", "LDLDIRECT" in the last 72 hours. Thyroid  Function Tests: No results for input(s): "TSH", "T4TOTAL", "FREET4", "T3FREE", "THYROIDAB" in the last 72 hours. Anemia Panel: No results for input(s): "VITAMINB12", "FOLATE", "FERRITIN", "TIBC", "IRON", "RETICCTPCT" in the last 72 hours. Sepsis Labs: No results for input(s): "PROCALCITON", "LATICACIDVEN" in the last 168 hours.  No results found for this or any previous visit (from the past 240 hours).       Radiology Studies: No results found.         LOS: 0 days   Time spent= 35 mins    Maggie Schooner, MD [facility]  If 7PM-7AM, please contact night-coverage  01/08/2024, 10:15 AM

## 2024-01-08 NOTE — Hospital Course (Addendum)
 Brief Narrative:  28 year old with history of DM1 with insulin  pump, uncontrolled HTN, progressive CKD admitted to the hospital for hematemesis and acute on chronic renal failure.  His outpatient nephrologist Dr. Zelda Hickman has been adjusting his medications accordingly in preparation to start him on hemodialysis and possibly also renal transplant evaluation.  Patient has had significant decline in renal function and now approaching ESRD.  Admission creatinine 7.33, February 2025 was 5.76.  Creatinine still remains elevated 7.29 > 7.03 > 6.62.  I have discussed case with patient's outpatient nephrology Dr. Zelda Hickman regarding his renal function, adjustments in blood pressure medication, iron infusion and rest of the lab work.  He will arrange for outpatient follow-up in the coming week for repeat blood work and further management.  Appreciate his input.  Medically stable for discharge today.   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  Admission creatinine 7.33, February 2025 was 5.76.  Creatinine still remains elevated 7.29 > 7.03 > 6.62.  I have discussed case with patient's outpatient nephrology Dr. Zelda Hickman regarding his renal function, adjustments in blood pressure medication, iron infusion and rest of the lab work.  He will arrange for outpatient follow-up in the coming week for repeat blood work and further management.  Appreciate his input.    Hypertension, uncontrolled - Increased Coreg and Norvasc .  Added hydralazine .  For now discontinue losartan    Type 1 diabetes; on insulin  pump -Carb modified diet.  Insulin  is currently managed by patient via his insulin  pump.  Patient would like to manage his own during the hospitalization. -Follows outpatient endocrinology  Recurrent convulsions - Recently seen by outpatient neurology.   Currently on Lamictal    Anemia of chronic disease -Hemoglobin around baseline 9.0.  Received IV iron infusion in the hospital, will discharge on p.o.    Hyperlipidemia -Crestor   DVT prophylaxis: SCDs    Code Status: Full Code Family Communication: Sisters at bedside Discharge today  Subjective: Feeling well no complaints.  Eating well.  Producing decent amount of clear urine.   Examination:  General exam: Appears calm and comfortable  Respiratory system: Clear to auscultation. Respiratory effort normal. Cardiovascular system: S1 & S2 heard, RRR. No JVD, murmurs, rubs, gallops or clicks. No pedal edema. Gastrointestinal system: Abdomen is nondistended, soft and nontender. No organomegaly or masses felt. Normal bowel sounds heard. Central nervous system: Alert and oriented. No focal neurological deficits. Extremities: Symmetric 5 x 5 power. Skin: No rashes, lesions or ulcers Psychiatry: Judgement and insight appear normal. Mood & affect appropriate.

## 2024-01-08 NOTE — Plan of Care (Signed)
  Problem: Education: Goal: Knowledge of General Education information will improve Description: Including pain rating scale, medication(s)/side effects and non-pharmacologic comfort measures Outcome: Progressing   Problem: Health Behavior/Discharge Planning: Goal: Ability to manage health-related needs will improve Outcome: Progressing   Problem: Clinical Measurements: Goal: Diagnostic test results will improve Outcome: Progressing   Problem: Nutrition: Goal: Adequate nutrition will be maintained Outcome: Progressing   Problem: Elimination: Goal: Will not experience complications related to bowel motility Outcome: Progressing Goal: Will not experience complications related to urinary retention Outcome: Progressing   Problem: Safety: Goal: Ability to remain free from injury will improve Outcome: Progressing   Problem: Nutritional: Goal: Ability to make healthy dietary choices will improve Outcome: Progressing

## 2024-01-09 DIAGNOSIS — N179 Acute kidney failure, unspecified: Secondary | ICD-10-CM | POA: Diagnosis not present

## 2024-01-09 LAB — CBC
HCT: 27.1 % — ABNORMAL LOW (ref 39.0–52.0)
Hemoglobin: 8.4 g/dL — ABNORMAL LOW (ref 13.0–17.0)
MCH: 27.5 pg (ref 26.0–34.0)
MCHC: 31 g/dL (ref 30.0–36.0)
MCV: 88.6 fL (ref 80.0–100.0)
Platelets: 276 10*3/uL (ref 150–400)
RBC: 3.06 MIL/uL — ABNORMAL LOW (ref 4.22–5.81)
RDW: 12.3 % (ref 11.5–15.5)
WBC: 7.4 10*3/uL (ref 4.0–10.5)
nRBC: 0 % (ref 0.0–0.2)

## 2024-01-09 LAB — BASIC METABOLIC PANEL WITH GFR
Anion gap: 10 (ref 5–15)
BUN: 48 mg/dL — ABNORMAL HIGH (ref 6–20)
CO2: 20 mmol/L — ABNORMAL LOW (ref 22–32)
Calcium: 7.8 mg/dL — ABNORMAL LOW (ref 8.9–10.3)
Chloride: 110 mmol/L (ref 98–111)
Creatinine, Ser: 7.03 mg/dL — ABNORMAL HIGH (ref 0.61–1.24)
GFR, Estimated: 10 mL/min — ABNORMAL LOW (ref >60)
Glucose, Bld: 116 mg/dL — ABNORMAL HIGH (ref 70–99)
Potassium: 3.3 mmol/L — ABNORMAL LOW (ref 3.5–5.1)
Sodium: 140 mmol/L (ref 135–145)

## 2024-01-09 LAB — GLUCOSE, CAPILLARY
Glucose-Capillary: 85 mg/dL (ref 70–99)
Glucose-Capillary: 124 mg/dL — ABNORMAL HIGH (ref 70–99)
Glucose-Capillary: 107 mg/dL — ABNORMAL HIGH (ref 70–99)
Glucose-Capillary: 121 mg/dL — ABNORMAL HIGH (ref 70–99)
Glucose-Capillary: 89 mg/dL (ref 70–99)

## 2024-01-09 LAB — PHOSPHORUS: Phosphorus: 5.8 mg/dL — ABNORMAL HIGH (ref 2.5–4.6)

## 2024-01-09 LAB — MAGNESIUM: Magnesium: 1.9 mg/dL (ref 1.7–2.4)

## 2024-01-09 MED ORDER — SODIUM CHLORIDE 0.9 % IV SOLN: INTRAVENOUS | Status: DC

## 2024-01-09 MED ORDER — IRON SUCROSE 400 MG IVPB - SIMPLE MED
400.0000 mg | Freq: Once | Status: AC
  Administered 2024-01-09: 400 mg via INTRAVENOUS
  Filled 2024-01-09: qty 270

## 2024-01-09 MED ORDER — AMLODIPINE BESYLATE 10 MG PO TABS
10.0000 mg | ORAL_TABLET | Freq: Every day | ORAL | Status: DC
  Administered 2024-01-09 – 2024-01-10 (×2): 10 mg via ORAL
  Filled 2024-01-09 (×2): qty 1

## 2024-01-09 MED ORDER — OXYCODONE HCL 5 MG PO TABS
5.0000 mg | ORAL_TABLET | ORAL | Status: DC | PRN
  Administered 2024-01-09 (×2): 5 mg via ORAL
  Filled 2024-01-09 (×2): qty 1

## 2024-01-09 NOTE — Progress Notes (Signed)
 PROGRESS NOTE    Stephen Prince  WJX:[id number] DOB: 02/16/1996 DOA: 01/07/2024 PCP: Roselind Congo, MD    Brief Narrative:  28 year old with history of DM1 with insulin  pump, uncontrolled HTN, progressive CKD admitted to the hospital for hematemesis and acute on chronic renal failure.  His outpatient nephrologist Dr. Zelda Hickman has been adjusting his medications accordingly in preparation to start him on hemodialysis and possibly also renal transplant evaluation.  Admission creatinine 7.33.   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  Admission creatinine 7.33, February 2025 was 5.76.  Creatinine still remains elevated 7.29 > 7.03, continue IV fluids  - Currently holding any nephrotoxic drugs.  Admitting provider discussed case with Dr. Edson Graces from nephrology who recommends hydrating the patient and eventually discharging him if renal function improves otherwise they will officially consult and consider initiating dialysis during this hospitalization.   Type 1 diabetes; on insulin  pump -Carb modified diet.  Insulin  is currently managed by patient via his insulin  pump.  Patient would like to manage his own during the hospitalization. -Follows outpatient endocrinology  Recurrent convulsions - Recently seen by outpatient neurology.  Currently on Lamictal    Anemia of chronic disease -Hemoglobin around baseline 9.0   Hypertension, uncontrolled - Continue Coreg.  Increase Norvasc .  Holding losartan    Hyperlipidemia -Crestor   DVT prophylaxis: SCDs    Code Status: Full Code Family Communication: Father at bedside Continue hospital stay for management of worsening renal function    Subjective:  Doing ok, making urine.   Examination:  General exam: Appears calm and comfortable  Respiratory system: Clear to auscultation.  Respiratory effort normal. Cardiovascular system: S1 & S2 heard, RRR. No JVD, murmurs, rubs, gallops or clicks. No pedal edema. Gastrointestinal system: Abdomen is nondistended, soft and nontender. No organomegaly or masses felt. Normal bowel sounds heard. Central nervous system: Alert and oriented. No focal neurological deficits. Extremities: Symmetric 5 x 5 power. Skin: No rashes, lesions or ulcers Psychiatry: Judgement and insight appear normal. Mood & affect appropriate.                Diet Orders (From admission, onward)     Start     Ordered   01/07/24 1352  Diet Carb Modified Fluid consistency: Thin  Diet effective now       Question Answer Comment  Calorie Level Medium 1600-2000   Fluid consistency: Thin      01/07/24 1351            Objective: Vitals:   01/08/24 2043 01/09/24 0237 01/09/24 1047 01/09/24 1049  BP: (!) 171/109 (!) 152/99 (!) 170/106   Pulse: 97 91  96  Resp: 18 18    Temp: 98.2 F (36.8 C) 98.1 F (36.7 C)    TempSrc: Oral Oral    SpO2: 98% 98%    Weight:      Height:        Intake/Output Summary (Last 24 hours) at 01/09/2024 1052 Last data filed at 01/08/2024 1500 Gross per 24 hour  Intake 934.13 ml  Output --  Net 934.13 ml   Filed Weights   01/07/24 0803  Weight: 72.8 kg    Scheduled Meds:  amLODipine   10 mg Oral Daily   calcitRIOL  0.25 mcg Oral Daily   carvedilol  3.125 mg Oral BID   insulin  pump  Subcutaneous TID WC, HS, 0200   lamoTRIgine   25 mg Oral BID   ondansetron  (ZOFRAN ) IV  4 mg Intravenous Once   rosuvastatin   20 mg Oral QHS   sertraline   150 mg Oral Daily   Continuous Infusions:  sodium chloride       Nutritional status     Body mass index is 25.14 kg/m.  Data Reviewed:   CBC: Recent Labs  Lab 01/07/24 0838 01/08/24 0632 01/09/24 0822  WBC 7.1 8.5 7.4  NEUTROABS 4.9  --   --   HGB 9.0* 8.8* 8.4*  HCT 27.6* 27.6* 27.1*  MCV 84.1 87.6 88.6  PLT 313 292 276   Basic Metabolic  Panel: Recent Labs  Lab 01/07/24 0838 01/08/24 0632 01/09/24 0822  NA 135 140 140  K 3.3* 3.5 3.3*  CL 102 107 110  CO2 19* 19* 20*  GLUCOSE 127* 110* 116*  BUN 63* 57* 48*  CREATININE 7.33* 7.29* 7.03*  CALCIUM  7.6* 7.5* 7.8*  MG  --   --  1.9  PHOS  --   --  5.8*   GFR: Estimated Creatinine Clearance: 14.9 mL/min (A) (by C-G formula based on SCr of 7.03 mg/dL (H)). Liver Function Tests: Recent Labs  Lab 01/07/24 0838  AST 16  ALT 17  ALKPHOS 73  BILITOT 0.2  PROT 6.5  ALBUMIN 3.2*   No results for input(s): "LIPASE", "AMYLASE" in the last 168 hours. No results for input(s): "AMMONIA" in the last 168 hours. Coagulation Profile: Recent Labs  Lab 01/07/24 1326  INR 1.1   Cardiac Enzymes: No results for input(s): "CKTOTAL", "CKMB", "CKMBINDEX", "TROPONINI" in the last 168 hours. BNP (last 3 results) No results for input(s): "PROBNP" in the last 8760 hours. HbA1C: No results for input(s): "HGBA1C" in the last 72 hours. CBG: Recent Labs  Lab 01/08/24 1156 01/08/24 1720 01/08/24 2044 01/09/24 0239 01/09/24 0739  GLUCAP 108* 114* 130* 85 124*   Lipid Profile: No results for input(s): "CHOL", "HDL", "LDLCALC", "TRIG", "CHOLHDL", "LDLDIRECT" in the last 72 hours. Thyroid  Function Tests: No results for input(s): "TSH", "T4TOTAL", "FREET4", "T3FREE", "THYROIDAB" in the last 72 hours. Anemia Panel: No results for input(s): "VITAMINB12", "FOLATE", "FERRITIN", "TIBC", "IRON", "RETICCTPCT" in the last 72 hours. Sepsis Labs: No results for input(s): "PROCALCITON", "LATICACIDVEN" in the last 168 hours.  No results found for this or any previous visit (from the past 240 hours).       Radiology Studies: No results found.         LOS: 1 day   Time spent= 35 mins    Maggie Schooner, MD [facility]  If 7PM-7AM, please contact night-coverage  01/09/2024, 10:52 AM

## 2024-01-09 NOTE — Progress Notes (Deleted)
   01/09/24 0945  TOC Brief Assessment  Insurance and Status Reviewed  Patient has primary care physician Yes  Home environment has been reviewed From Home  Prior level of function: Independent  Prior/Current Home Services No current home services  Social Drivers of Health Review SDOH reviewed no interventions necessary  Readmission risk has been reviewed Yes Stephen Prince)  Transition of care needs no transition of care needs at this time    Transition of Care Department (TOC) has reviewed patient and no TOC needs have been identified at this time. We will continue to monitor patient advancement through interdisciplinary progression rounds. If new patient transition needs arise, please place a TOC consult.

## 2024-01-09 NOTE — Progress Notes (Signed)
   01/09/24 0945  TOC Brief Assessment  Insurance and Status Reviewed  Patient has primary care physician Yes  Home environment has been reviewed From Home  Prior level of function: Independent  Prior/Current Home Services No current home services  Social Drivers of Health Review SDOH reviewed no interventions necessary  Readmission risk has been reviewed Yes Stephen Prince)  Transition of care needs no transition of care needs at this time    Transition of Care Department (TOC) has reviewed patient and no TOC needs have been identified at this time. We will continue to monitor patient advancement through interdisciplinary progression rounds. If new patient transition needs arise, please place a TOC consult.

## 2024-01-09 NOTE — Plan of Care (Signed)
 VSS. Patient c/o left ear and left tooth pain, given one time dose of Oxycodone and PRN Tylenol  (provider aware). BG 130-85 overnight. Insulin  pump remains in place. NS infusing at 125ml/hr. LBM 4/26. No acute events overnight.  Problem: Education: Goal: Knowledge of General Education information will improve Description: Including pain rating scale, medication(s)/side effects and non-pharmacologic comfort measures Outcome: Progressing   Problem: Health Behavior/Discharge Planning: Goal: Ability to manage health-related needs will improve Outcome: Progressing   Problem: Clinical Measurements: Goal: Ability to maintain clinical measurements within normal limits will improve Outcome: Progressing   Problem: Pain Managment: Goal: General experience of comfort will improve and/or be controlled Outcome: Progressing   Problem: Safety: Goal: Ability to remain free from injury will improve Outcome: Progressing   Problem: Education: Goal: Knowledge of disease and its progression will improve Outcome: Progressing   Problem: Fluid Volume: Goal: Compliance with measures to maintain balanced fluid volume will improve Outcome: Progressing   Problem: Nutritional: Goal: Ability to make healthy dietary choices will improve Outcome: Progressing   Problem: Clinical Measurements: Goal: Complications related to the disease process, condition or treatment will be avoided or minimized Outcome: Progressing

## 2024-01-09 NOTE — Plan of Care (Signed)

## 2024-01-10 DIAGNOSIS — N179 Acute kidney failure, unspecified: Secondary | ICD-10-CM | POA: Diagnosis not present

## 2024-01-10 LAB — SURGICAL PATHOLOGY

## 2024-01-10 LAB — GLUCOSE, CAPILLARY
Glucose-Capillary: 98 mg/dL (ref 70–99)
Glucose-Capillary: 95 mg/dL (ref 70–99)

## 2024-01-10 LAB — MAGNESIUM: Magnesium: 1.9 mg/dL (ref 1.7–2.4)

## 2024-01-10 LAB — CBC
HCT: 27.2 % — ABNORMAL LOW (ref 39.0–52.0)
Hemoglobin: 8.6 g/dL — ABNORMAL LOW (ref 13.0–17.0)
MCH: 27.6 pg (ref 26.0–34.0)
MCHC: 31.6 g/dL (ref 30.0–36.0)
MCV: 87.2 fL (ref 80.0–100.0)
Platelets: 271 10*3/uL (ref 150–400)
RBC: 3.12 MIL/uL — ABNORMAL LOW (ref 4.22–5.81)
RDW: 12.5 % (ref 11.5–15.5)
WBC: 8.2 10*3/uL (ref 4.0–10.5)
nRBC: 0 % (ref 0.0–0.2)

## 2024-01-10 LAB — BASIC METABOLIC PANEL WITH GFR
Anion gap: 11 (ref 5–15)
BUN: 42 mg/dL — ABNORMAL HIGH (ref 6–20)
CO2: 17 mmol/L — ABNORMAL LOW (ref 22–32)
Calcium: 7.9 mg/dL — ABNORMAL LOW (ref 8.9–10.3)
Chloride: 109 mmol/L (ref 98–111)
Creatinine, Ser: 6.62 mg/dL — ABNORMAL HIGH (ref 0.61–1.24)
GFR, Estimated: 11 mL/min — ABNORMAL LOW (ref >60)
Glucose, Bld: 87 mg/dL (ref 70–99)
Potassium: 3.2 mmol/L — ABNORMAL LOW (ref 3.5–5.1)
Sodium: 137 mmol/L (ref 135–145)

## 2024-01-10 MED ORDER — SODIUM CHLORIDE 0.9 % IV SOLN: INTRAVENOUS | Status: DC

## 2024-01-10 MED ORDER — POTASSIUM CHLORIDE CRYS ER 20 MEQ PO TBCR
20.0000 meq | EXTENDED_RELEASE_TABLET | Freq: Once | ORAL | Status: AC
  Administered 2024-01-10: 20 meq via ORAL
  Filled 2024-01-10: qty 1

## 2024-01-10 MED ORDER — CARVEDILOL 12.5 MG PO TABS: 12.5000 mg | ORAL_TABLET | Freq: Two times a day (BID) | ORAL | 0 refills | Status: AC

## 2024-01-10 MED ORDER — CARVEDILOL 12.5 MG PO TABS
12.5000 mg | ORAL_TABLET | Freq: Two times a day (BID) | ORAL | Status: DC
Start: 2024-01-10 — End: 2024-01-10
  Administered 2024-01-10: 12.5 mg via ORAL
  Filled 2024-01-10: qty 1

## 2024-01-10 MED ORDER — SENNOSIDES-DOCUSATE SODIUM 8.6-50 MG PO TABS: 1.0000 | ORAL_TABLET | Freq: Every evening | ORAL | 0 refills | Status: AC | PRN

## 2024-01-10 MED ORDER — HYDRALAZINE HCL 25 MG PO TABS: 25.0000 mg | ORAL_TABLET | Freq: Three times a day (TID) | ORAL | 0 refills | Status: AC

## 2024-01-10 MED ORDER — FERROUS SULFATE 325 (65 FE) MG PO TBEC: 325.0000 mg | DELAYED_RELEASE_TABLET | Freq: Two times a day (BID) | ORAL | 0 refills | Status: AC

## 2024-01-10 MED ORDER — AMLODIPINE BESYLATE 10 MG PO TABS: 10.0000 mg | ORAL_TABLET | Freq: Every day | ORAL | 0 refills | Status: AC

## 2024-01-10 NOTE — Plan of Care (Addendum)
 BP elevated, given PRN Hydralazine . All other VSS. Patient c/o low back pain, given PRN Oxycodone. BG 89-98 overnight. NS infusing at 125ml/hr. No acute events overnight.  Problem: Education: Goal: Knowledge of General Education information will improve Description: Including pain rating scale, medication(s)/side effects and non-pharmacologic comfort measures Outcome: Progressing   Problem: Clinical Measurements: Goal: Ability to maintain clinical measurements within normal limits will improve Outcome: Progressing Goal: Will remain free from infection Outcome: Progressing   Problem: Nutrition: Goal: Adequate nutrition will be maintained Outcome: Progressing   Problem: Coping: Goal: Level of anxiety will decrease Outcome: Progressing   Problem: Elimination: Goal: Will not experience complications related to urinary retention Outcome: Progressing   Problem: Pain Managment: Goal: General experience of comfort will improve and/or be controlled Outcome: Progressing   Problem: Safety: Goal: Ability to remain free from injury will improve Outcome: Progressing   Problem: Education: Goal: Knowledge of disease and its progression will improve Outcome: Progressing   Problem: Fluid Volume: Goal: Compliance with measures to maintain balanced fluid volume will improve Outcome: Progressing

## 2024-01-10 NOTE — Discharge Summary (Signed)
 Physician Discharge Summary  Stephen Prince ZOX:[id number] DOB: 02-13-1996 DOA: 01/07/2024  PCP: Roselind Congo, MD  Admit date: 01/07/2024 Discharge date: 01/10/2024  Admitted From: Home Disposition: Home  Recommendations for Outpatient Follow-up:  Follow up with PCP in 1-2 weeks Please obtain BMP/CBC in one week your next doctors visit.  Discontinue losartan .  Increased dose of Norvasc  and Coreg.  Hydralazine  3 times daily added.  Will be further adjusted outpatient as necessary Follow-up with nephrology next week Iron supplements with bowel regimen prescribed  Discharge Condition: Stable CODE STATUS: Full code code Diet recommendation: Renal  Brief/Interim Summary: Brief Narrative:  28 year old with history of DM1 with insulin  pump, uncontrolled HTN, progressive CKD admitted to the hospital for hematemesis and acute on chronic renal failure.  His outpatient nephrologist Dr. Zelda Hickman has been adjusting his medications accordingly in preparation to start him on hemodialysis and possibly also renal transplant evaluation.  Patient has had significant decline in renal function and now approaching ESRD.  Admission creatinine 7.33, February 2025 was 5.76.  Creatinine still remains elevated 7.29 > 7.03 > 6.62.  I have discussed case with patient's outpatient nephrology Dr. Zelda Hickman regarding his renal function, adjustments in blood pressure medication, iron infusion and rest of the lab work.  He will arrange for outpatient follow-up in the coming week for repeat blood work and further management.  Appreciate his input.  Medically stable for discharge today.   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  Admission creatinine 7.33, February 2025 was 5.76.  Creatinine still remains elevated 7.29 > 7.03 > 6.62.  I have discussed case with  patient's outpatient nephrology Dr. Zelda Hickman regarding his renal function, adjustments in blood pressure medication, iron infusion and rest of the lab work.  He will arrange for outpatient follow-up in the coming week for repeat blood work and further management.  Appreciate his input.    Hypertension, uncontrolled - Increased Coreg and Norvasc .  Added hydralazine .  For now discontinue losartan    Type 1 diabetes; on insulin  pump -Carb modified diet.  Insulin  is currently managed by patient via his insulin  pump.  Patient would like to manage his own during the hospitalization. -Follows outpatient endocrinology  Recurrent convulsions - Recently seen by outpatient neurology.  Currently on Lamictal    Anemia of chronic disease -Hemoglobin around baseline 9.0.  Received IV iron infusion in the hospital, will discharge on p.o.    Hyperlipidemia -Crestor   DVT prophylaxis: SCDs    Code Status: Full Code Family Communication: Sisters at bedside Discharge today  Subjective: Feeling well no complaints.  Eating well.  Producing decent amount of clear urine.   Examination:  General exam: Appears calm and comfortable  Respiratory system: Clear to auscultation. Respiratory effort normal. Cardiovascular system: S1 & S2 heard, RRR. No JVD, murmurs, rubs, gallops or clicks. No pedal edema. Gastrointestinal system: Abdomen is nondistended, soft and nontender. No organomegaly or masses felt. Normal bowel sounds heard. Central nervous system: Alert and oriented. No focal neurological deficits. Extremities: Symmetric 5 x 5 power. Skin: No rashes, lesions or ulcers Psychiatry: Judgement and insight appear normal. Mood & affect appropriate.    Discharge Diagnoses:  Principal Problem:   AKI (acute kidney injury) [facility])      Discharge Exam: Vitals:   01/10/24 0848 01/10/24 0957  BP: (!) 181/114 (!) 158/97  Pulse:  99  Resp:    Temp:    SpO2:     Vitals:   01/10/24 0446 01/10/24 0558  01/10/24 0848 01/10/24 0957  BP: (!) 183/113 (!) 169/104 (!) 181/114 (!) 158/97  Pulse: 97 96  99  Resp: 20     Temp: 98 F (36.7 C)     TempSrc: Oral     SpO2: 94%     Weight:      Height:          Discharge Instructions   Allergies as of 01/10/2024   No Known Allergies      Medication List     STOP taking these medications    furosemide 40 MG tablet Commonly known as: LASIX   losartan  100 MG tablet Commonly known as: COZAAR        TAKE these medications    amLODipine  10 MG tablet Commonly known as: NORVASC  Take 1 tablet (10 mg total) by mouth daily. Start taking on: January 11, 2024 What changed:  medication strength how much to take   calcitRIOL 0.25 MCG capsule Commonly known as: ROCALTROL Take 0.25 mcg by mouth daily.   carvedilol 12.5 MG tablet Commonly known as: COREG Take 1 tablet (12.5 mg total) by mouth 2 (two) times daily. What changed:  medication strength how much to take   ferrous sulfate 325 (65 FE) MG EC tablet Take 1 tablet (325 mg total) by mouth 2 (two) times daily.   Gvoke HypoPen 2-Pack 1 MG/0.2ML Soaj Generic drug: Glucagon Inject 1 pen  into the skin once as needed (For severe hypoglycemia).   hydrALAZINE  25 MG tablet Commonly known as: APRESOLINE  Take 1 tablet (25 mg total) by mouth 3 (three) times daily.   insulin  aspart 100 UNIT/ML injection Commonly known as: novoLOG  Inject 300 Units into the skin every 3 (three) days. Insulin  pump   lamoTRIgine  25 MG tablet Commonly known as: LAMICTAL  Take 2 tablets twice a day What changed:  how much to take how to take this when to take this additional instructions   ondansetron  4 MG tablet Commonly known as: ZOFRAN  Take 1 tablet (4 mg total) by mouth every 6 (six) hours. What changed:  when to take this reasons to take this   rosuvastatin  20 MG tablet Commonly known as: CRESTOR  Take 20 mg by mouth at bedtime.   senna-docusate 8.6-50 MG tablet Commonly known as:  Senokot-S Take 1 tablet by mouth at bedtime as needed for moderate constipation.   sertraline  100 MG tablet Commonly known as: ZOLOFT  Take 100 mg by mouth daily.   T:slim X2 Ins Pump/Control-IQ Devi 1 Device by Does not apply route every 3 (three) days. Change site every 3 days   Vitamin D (Ergocalciferol) 1.25 MG (50000 UNIT) Caps capsule Commonly known as: DRISDOL Take 50,000 Units by mouth once a week. Tuesday        Follow-up Information     Roselind Congo, MD Follow up in 1 week(s).   Specialty: Family Medicine Contact information: [phone] [address] [place] [address] [phone]                No Known Allergies  You were cared for by a hospitalist during your hospital stay. If you have any questions about your discharge medications or the care you received while you were in the hospital after you are discharged, you can call the unit and asked to speak with the hospitalist on call if the hospitalist that took care  of you is not available. Once you are discharged, your primary care physician will handle any further medical issues. Please note that no refills for any discharge medications will be authorized once you are discharged, as it is imperative that you return to your primary care physician (or establish a relationship with a primary care physician if you do not have one) for your aftercare needs so that they can reassess your need for medications and monitor your lab values.  You were cared for by a hospitalist during your hospital stay. If you have any questions about your discharge medications or the care you received while you were in the hospital after you are discharged, you can call the unit and asked to speak with the hospitalist on call if the hospitalist that took care of you is not available. Once you are discharged, your primary care physician will handle any further medical issues. Please note that NO REFILLS for any discharge  medications will be authorized once you are discharged, as it is imperative that you return to your primary care physician (or establish a relationship with a primary care physician if you do not have one) for your aftercare needs so that they can reassess your need for medications and monitor your lab values.  Please request your Prim.MD to go over all Hospital Tests and Procedure/Radiological results at the follow up, please get all Hospital records sent to your Prim MD by signing hospital release before you go home.  Get CBC, CMP, 2 view Chest X ray checked  by Primary MD during your next visit or SNF MD in 5-7 days ( we routinely change or add medications that can affect your baseline labs and fluid status, therefore we recommend that you get the mentioned basic workup next visit with your PCP, your PCP may decide not to get them or add new tests based on their clinical decision)  On your next visit with your primary care physician please Get Medicines reviewed and adjusted.  If you experience worsening of your admission symptoms, develop shortness of breath, life threatening emergency, suicidal or homicidal thoughts you must seek medical attention immediately by calling 911 or calling your MD immediately  if symptoms less severe.  You Must read complete instructions/literature along with all the possible adverse reactions/side effects for all the Medicines you take and that have been prescribed to you. Take any new Medicines after you have completely understood and accpet all the possible adverse reactions/side effects.   Do not drive, operate heavy machinery, perform activities at heights, swimming or participation in water activities or provide baby sitting services if your were admitted for syncope or siezures until you have seen by Primary MD or a Neurologist and advised to do so again.  Do not drive when taking Pain medications.   Procedures/Studies: No results found.   The results of  significant diagnostics from this hospitalization (including imaging, microbiology, ancillary and laboratory) are listed below for reference.     Microbiology: No results found for this or any previous visit (from the past 240 hours).   Labs: BNP (last 3 results) No results for input(s): "BNP" in the last 8760 hours. Basic Metabolic Panel: Recent Labs  Lab 01/07/24 0838 01/08/24 0632 01/09/24 0822 01/10/24 0516  NA 135 140 140 137  K 3.3* 3.5 3.3* 3.2*  CL 102 107 110 109  CO2 19* 19* 20* 17*  GLUCOSE 127* 110* 116* 87  BUN 63* 57* 48* 42*  CREATININE 7.33* 7.29* 7.03* 6.62*  CALCIUM  7.6* 7.5* 7.8* 7.9*  MG  --   --  1.9 1.9  PHOS  --   --  5.8*  --    Liver Function Tests: Recent Labs  Lab 01/07/24 0838  AST 16  ALT 17  ALKPHOS 73  BILITOT 0.2  PROT 6.5  ALBUMIN 3.2*   No results for input(s): "LIPASE", "AMYLASE" in the last 168 hours. No results for input(s): "AMMONIA" in the last 168 hours. CBC: Recent Labs  Lab 01/07/24 0838 01/08/24 0632 01/09/24 0822 01/10/24 0516  WBC 7.1 8.5 7.4 8.2  NEUTROABS 4.9  --   --   --   HGB 9.0* 8.8* 8.4* 8.6*  HCT 27.6* 27.6* 27.1* 27.2*  MCV 84.1 87.6 88.6 87.2  PLT 313 292 276 271   Cardiac Enzymes: No results for input(s): "CKTOTAL", "CKMB", "CKMBINDEX", "TROPONINI" in the last 168 hours. BNP: Invalid input(s): "POCBNP" CBG: Recent Labs  Lab 01/09/24 1207 01/09/24 1713 01/09/24 2031 01/10/24 0312 01/10/24 0746  GLUCAP 107* 121* 89 98 95   D-Dimer No results for input(s): "DDIMER" in the last 72 hours. Hgb A1c No results for input(s): "HGBA1C" in the last 72 hours. Lipid Profile No results for input(s): "CHOL", "HDL", "LDLCALC", "TRIG", "CHOLHDL", "LDLDIRECT" in the last 72 hours. Thyroid  function studies No results for input(s): "TSH", "T4TOTAL", "T3FREE", "THYROIDAB" in the last 72 hours.  Invalid input(s): "FREET3" Anemia work up No results for input(s): "VITAMINB12", "FOLATE", "FERRITIN", "TIBC",  "IRON", "RETICCTPCT" in the last 72 hours. Urinalysis    Component Value Date/Time   COLORURINE STRAW (A) 01/07/2024 1318   APPEARANCEUR CLEAR 01/07/2024 1318   LABSPEC 1.008 01/07/2024 1318   PHURINE 6.0 01/07/2024 1318   GLUCOSEU 50 (A) 01/07/2024 1318   HGBUR SMALL (A) 01/07/2024 1318   BILIRUBINUR NEGATIVE 01/07/2024 1318   KETONESUR NEGATIVE 01/07/2024 1318   PROTEINUR >=300 (A) 01/07/2024 1318   NITRITE NEGATIVE 01/07/2024 1318   LEUKOCYTESUR NEGATIVE 01/07/2024 1318   Sepsis Labs Recent Labs  Lab 01/07/24 0838 01/08/24 0632 01/09/24 0822 01/10/24 0516  WBC 7.1 8.5 7.4 8.2   Microbiology No results found for this or any previous visit (from the past 240 hours).   Time coordinating discharge:  I have spent 35 minutes face to face with the patient and on the ward discussing the patients care, assessment, plan and disposition with other care givers. >50% of the time was devoted counseling the patient about the risks and benefits of treatment/Discharge disposition and coordinating care.   SIGNED:   Maggie Schooner, MD  [facility] 01/10/2024, 10:40 AM   If 7PM-7AM, please contact night-coverage

## 2024-01-10 NOTE — Progress Notes (Signed)
 AVS reviewed w/ pt  who verbalized an understanding. PIV removed as noted. Pt dressing for d/c to home. To lobby via w/c- home w/ sisters

## 2024-01-11 ENCOUNTER — Inpatient Hospital Stay (HOSPITAL_COMMUNITY)
Admit: 2024-01-11 | Discharge: 2024-01-11 | Disposition: A | Discharge status: Home / Self Care | Attending: Nephrology | Admitting: Nephrology

## 2024-01-14 ENCOUNTER — Encounter (HOSPITAL_COMMUNITY)

## 2024-01-17 ENCOUNTER — Encounter (HOSPITAL_COMMUNITY): Payer: Self-pay

## 2024-01-17 ENCOUNTER — Emergency Department (HOSPITAL_COMMUNITY)
Admission: EM | Admit: 2024-01-17 | Discharge: 2024-01-17 | Disposition: A | Discharge status: Home / Self Care | Attending: Emergency Medicine | Admitting: Emergency Medicine

## 2024-01-17 ENCOUNTER — Emergency Department (HOSPITAL_COMMUNITY)

## 2024-01-17 DIAGNOSIS — Z79899 Other long term (current) drug therapy: Secondary | ICD-10-CM | POA: Insufficient documentation

## 2024-01-17 DIAGNOSIS — R7989 Other specified abnormal findings of blood chemistry: Secondary | ICD-10-CM | POA: Insufficient documentation

## 2024-01-17 DIAGNOSIS — N4889 Other specified disorders of penis: Secondary | ICD-10-CM | POA: Diagnosis present

## 2024-01-17 DIAGNOSIS — I120 Hypertensive chronic kidney disease with stage 5 chronic kidney disease or end stage renal disease: Secondary | ICD-10-CM | POA: Diagnosis not present

## 2024-01-17 DIAGNOSIS — Z794 Long term (current) use of insulin: Secondary | ICD-10-CM | POA: Diagnosis not present

## 2024-01-17 DIAGNOSIS — Z992 Dependence on renal dialysis: Secondary | ICD-10-CM | POA: Diagnosis not present

## 2024-01-17 DIAGNOSIS — D631 Anemia in chronic kidney disease: Secondary | ICD-10-CM | POA: Insufficient documentation

## 2024-01-17 DIAGNOSIS — E8809 Other disorders of plasma-protein metabolism, not elsewhere classified: Secondary | ICD-10-CM | POA: Diagnosis not present

## 2024-01-17 DIAGNOSIS — E1022 Type 1 diabetes mellitus with diabetic chronic kidney disease: Secondary | ICD-10-CM | POA: Diagnosis not present

## 2024-01-17 DIAGNOSIS — N186 End stage renal disease: Secondary | ICD-10-CM | POA: Insufficient documentation

## 2024-01-17 LAB — CBC WITH DIFFERENTIAL/PLATELET
Abs Immature Granulocytes: 0.02 10*3/uL (ref 0.00–0.07)
Basophils Absolute: 0 10*3/uL (ref 0.0–0.1)
Basophils Relative: 0 %
Eosinophils Absolute: 0.2 10*3/uL (ref 0.0–0.5)
Eosinophils Relative: 3 %
HCT: 26.3 % — ABNORMAL LOW (ref 39.0–52.0)
Hemoglobin: 8.5 g/dL — ABNORMAL LOW (ref 13.0–17.0)
Immature Granulocytes: 0 %
Lymphocytes Relative: 20 %
Lymphs Abs: 1.4 10*3/uL (ref 0.7–4.0)
MCH: 27.9 pg (ref 26.0–34.0)
MCHC: 32.3 g/dL (ref 30.0–36.0)
MCV: 86.2 fL (ref 80.0–100.0)
Monocytes Absolute: 0.6 10*3/uL (ref 0.1–1.0)
Monocytes Relative: 8 %
Neutro Abs: 4.9 10*3/uL (ref 1.7–7.7)
Neutrophils Relative %: 69 %
Platelets: 294 10*3/uL (ref 150–400)
RBC: 3.05 MIL/uL — ABNORMAL LOW (ref 4.22–5.81)
RDW: 12.9 % (ref 11.5–15.5)
WBC: 7.2 10*3/uL (ref 4.0–10.5)
nRBC: 0 % (ref 0.0–0.2)

## 2024-01-17 LAB — COMPREHENSIVE METABOLIC PANEL WITH GFR
ALT: 24 U/L (ref 0–44)
AST: 15 U/L (ref 15–41)
Albumin: 3 g/dL — ABNORMAL LOW (ref 3.5–5.0)
Alkaline Phosphatase: 76 U/L (ref 38–126)
Anion gap: 14 (ref 5–15)
BUN: 61 mg/dL — ABNORMAL HIGH (ref 6–20)
CO2: 14 mmol/L — ABNORMAL LOW (ref 22–32)
Calcium: 7.8 mg/dL — ABNORMAL LOW (ref 8.9–10.3)
Chloride: 109 mmol/L (ref 98–111)
Creatinine, Ser: 8 mg/dL — ABNORMAL HIGH (ref 0.61–1.24)
GFR, Estimated: 9 mL/min — ABNORMAL LOW (ref >60)
Glucose, Bld: 134 mg/dL — ABNORMAL HIGH (ref 70–99)
Potassium: 3.5 mmol/L (ref 3.5–5.1)
Sodium: 137 mmol/L (ref 135–145)
Total Bilirubin: 0.5 mg/dL (ref 0.0–1.2)
Total Protein: 6.2 g/dL — ABNORMAL LOW (ref 6.5–8.1)

## 2024-01-17 LAB — URINALYSIS, ROUTINE W REFLEX MICROSCOPIC
Bilirubin Urine: NEGATIVE
Glucose, UA: 150 mg/dL — AB
Ketones, ur: NEGATIVE mg/dL
Leukocytes,Ua: NEGATIVE
Nitrite: NEGATIVE
Protein, ur: >=300 mg/dL — AB
Specific Gravity, Urine: 1.008 (ref 1.005–1.030)
pH: 6 (ref 5.0–8.0)

## 2024-01-17 NOTE — Discharge Instructions (Addendum)
 The swelling in the penis is likely due to swelling that is the result of your kidney disease. Please contact the office listed below to schedule an appointment with urologist Dr. Cathi Cluster within the next week for follow up.   Please apply compression to the area with wraps or snug underwear to help with the swelling.   Your creatinine was elevated at 8 today which seems a little bit higher than your than your baseline.  Please attend your nephrology appointment on Wednesday for follow-up of this value.  Your blood pressures were also elevated today at 170/113, please discuss this with your nephrologist.  I am included your CT scan results below for your reference.  Please make your nephrologist and PCP aware of these findings.  Return to the ER if you have any abdominal pain, skin changes of your penis, severe pain, any other new or concerning symptoms  "EXAM: CT ABDOMEN AND PELVIS WITHOUT CONTRAST   TECHNIQUE: Multidetector CT imaging of the abdomen and pelvis was performed following the standard protocol without IV contrast.   RADIATION DOSE REDUCTION: This exam was performed according to the departmental dose-optimization program which includes automated exposure control, adjustment of the mA and/or kV according to patient size and/or use of iterative reconstruction technique.   COMPARISON:  Scrotal ultrasound 01/17/2024   FINDINGS: Lower chest: Low-density blood pool suggests anemia. Small to moderate right and small left pleural effusion. Mild secondary pulmonary lobular interstitial accentuation at the lung bases which could be an indicator of mild interstitial pulmonary edema. Passive atelectasis in both lower lobes.   Hepatobiliary: Unremarkable   Pancreas: Unremarkable   Spleen:   Adrenals/Urinary Tract: Unremarkable both adrenal glands appear normal. Three speckled calcifications along the right kidney could represent a tiny punctate renal calculi. No hydronephrosis  or hydroureter. Current urinary bladder volume 760 cc.   Stomach/Bowel: Unremarkable.  Normal appendix.   Vascular/Lymphatic: Scattered small retroperitoneal lymph nodes could be reactive but are not pathologically enlarged.   Reproductive: Penoscrotal edema partially observed.   Other: Diffuse subcutaneous edema. Low-level mesenteric edema with trace pelvic ascites.   Musculoskeletal: Unremarkable   IMPRESSION: 1. Right greater than left pleural effusions with diffuse subcutaneous edema, mild ascites and mild mesenteric edema, and penoscrotal edema. Questionable minimal interstitial edema in the lung bases. Appearance favors third spacing of fluid. The patient's hypoproteinemia and hypoalbuminemia may be contributory to oncotic component of Starling force derangement. 2. Low-density blood pool suggests anemia. 3. Three speckled calcifications along the right kidney could represent a tiny punctate renal calculi. No hydronephrosis or hydroureter. 4. Current urinary bladder volume 760 cc.     Electronically Signed   By: Freida Jes M.D.   On: 01/17/2024 18:09"

## 2024-01-17 NOTE — ED Triage Notes (Signed)
 Pt arrived reporting ble testicular swelling that started 2 days ago. Denies d/c, pain, burning or any other symptoms. Patient states it started randomly two days ago, getting worse.

## 2024-01-17 NOTE — Consult Note (Signed)
 I have been asked to see the patient by Dr. Russella Courts, for evaluation and management of swelling.  History of present illness: 28 year old male presented to the ER after several days of penile swelling.  Swelling is occurred just proximal to the corona.  There is no observed erythema, tenderness, or pain.  He denies any issues with urination denies any gross hematuria has never had something like this happen before.  Patient does have a long history of edema due to nephrotic syndrome secondary to his stage IV CKD secondary to type 1 diabetes.  UA today shows urine greater than 300 protein in it.  UA is otherwise negative for infection exam is consistent with pedal edema.   Review of systems: A 12 point comprehensive review of systems was obtained and is negative unless otherwise stated in the history of present illness.  Patient Active Problem List   Diagnosis Date Noted   AKI (acute kidney injury) (HCC) 01/07/2024   Seizure (HCC) 05/12/2023   Hypoglycemia associated with diabetes (HCC) 05/12/2023    No current facility-administered medications on file prior to encounter.   Current Outpatient Medications on File Prior to Encounter  Medication Sig Dispense Refill   amLODipine  (NORVASC ) 10 MG tablet Take 1 tablet (10 mg total) by mouth daily. 30 tablet 0   calcitRIOL  (ROCALTROL ) 0.25 MCG capsule Take 0.25 mcg by mouth daily.     carvedilol  (COREG ) 12.5 MG tablet Take 1 tablet (12.5 mg total) by mouth 2 (two) times daily. 60 tablet 0   ferrous sulfate  325 (65 FE) MG EC tablet Take 1 tablet (325 mg total) by mouth 2 (two) times daily. 60 tablet 0   Glucagon (GVOKE HYPOPEN 2-PACK) 1 MG/0.2ML SOAJ Inject 1 pen  into the skin once as needed (For severe hypoglycemia).     hydrALAZINE  (APRESOLINE ) 25 MG tablet Take 1 tablet (25 mg total) by mouth 3 (three) times daily. 90 tablet 0   insulin  aspart (NOVOLOG ) 100 UNIT/ML injection Inject 300 Units into the skin every 3 (three) days. Insulin  pump      Insulin  Infusion Pump (T:SLIM X2 INS PUMP/CONTROL-IQ) DEVI 1 Device by Does not apply route every 3 (three) days. Change site every 3 days     lamoTRIgine  (LAMICTAL ) 25 MG tablet Take 2 tablets twice a day (Patient taking differently: Take 50 mg by mouth daily.) 120 tablet 6   ondansetron  (ZOFRAN ) 4 MG tablet Take 1 tablet (4 mg total) by mouth every 6 (six) hours. (Patient taking differently: Take 4 mg by mouth every 8 (eight) hours as needed for vomiting, nausea or refractory nausea / vomiting.) 12 tablet 0   rosuvastatin  (CRESTOR ) 20 MG tablet Take 20 mg by mouth at bedtime.     senna-docusate (SENOKOT-S) 8.6-50 MG tablet Take 1 tablet by mouth at bedtime as needed for moderate constipation. 30 tablet 0   sertraline  (ZOLOFT ) 100 MG tablet Take 100 mg by mouth daily.     Vitamin D, Ergocalciferol, (DRISDOL) 1.25 MG (50000 UNIT) CAPS capsule Take 50,000 Units by mouth once a week. Tuesday      Past Medical History:  Diagnosis Date   Chronic kidney disease    Diabetes mellitus without complication (HCC)     History reviewed. No pertinent surgical history.  Social History   Tobacco Use   Smoking status: Never   Smokeless tobacco: Never  Vaping Use   Vaping status: Never Used  Substance Use Topics   Alcohol use: Never   Drug use: Never  Family History  Problem Relation Age of Onset   Diabetes Mother    Hypertension Father     PE: Vitals:   01/17/24 1142 01/17/24 1617  BP: (!) 151/103 (!) 170/113  Pulse: 95 89  Resp: 18 16  Temp: 98 F (36.7 C) 98 F (36.7 C)  TempSrc: Oral Oral  SpO2: 98% 95%   Patient appears to be in no acute distress  patient is alert and oriented x3 Respiratory: Normal breathing room air Cardiovascular: Regular rate and rhythm per monitor GU: Circumcised penis glans is normal, patent meatus, just proximal to the corona there was significant swelling of the shaft in a ringlike fashion there is no tightness or pain to the penis the skin is not  erythematous or tender there is no rashes appreciated.  Exam is consistent with edema.  There is no fluctuance no crepitus or induration. Scrotal exam is normal.  No results for input(s): "WBC", "HGB", "HCT" in the last 72 hours. No results for input(s): "NA", "K", "CL", "CO2", "GLUCOSE", "BUN", "CREATININE", "CALCIUM " in the last 72 hours. No results for input(s): "LABPT", "INR" in the last 72 hours. No results for input(s): "LABURIN" in the last 72 hours. No results found for this or any previous visit.  Imaging: none  Imp: 28 year old male with penile swelling.  Differential includes swelling secondary to CKD and nephrotic syndrome causing chronic edema, malignancy, abnormal viral illness like HSP.  Likely this is secondary to his nephrotic syndrome and CKD resulting in overall peripheral edema recommend repeating chemistry as most recent creatinine is 6.6 and UA today shows greater than 300 of protein in the urine.  Low likelihood of malignancy but recommend Noncon CT to rule out any lymph adenopathy in the pelvis.  HSP very low on differential due to no rashes or other systemic symptoms.  No follow-up required by urology recommendations include scrotal support and penile compression with tight underwear. Recommend ER get CT scan to follow-up for any lymphadenopathy  Urology to sign off at this time.  Recommendations:   Thank you for involving me in this patient's care. Thelbert Finner

## 2024-01-17 NOTE — ED Provider Notes (Signed)
 [place] AT [facility] Provider Note   CSN: [id number] Arrival date & time: 01/17/24  1136     History  Chief Complaint  Patient presents with   Groin Swelling    Stephen Prince is a 28 y.o. male with end-stage renal disease, type 1 diabetes, presents with concern for swelling around his penis that started about 2 days ago.  Reports the area is somewhat painful.  Denies any penile discharge, dysuria, skin changes, or difficulty urinating.  He states he is not sexually active and does not have any concern for STIs.  HPI     Home Medications Prior to Admission medications   Medication Sig Start Date End Date Taking? Authorizing Provider  amLODipine  (NORVASC ) 10 MG tablet Take 1 tablet (10 mg total) by mouth daily. 01/11/24   Amin, Ankit C, MD  calcitRIOL  (ROCALTROL ) 0.25 MCG capsule Take 0.25 mcg by mouth daily. 01/04/24   [provider]  carvedilol  (COREG ) 12.5 MG tablet Take 1 tablet (12.5 mg total) by mouth 2 (two) times daily. 01/10/24   Amin, Ankit C, MD  ferrous sulfate  325 (65 FE) MG EC tablet Take 1 tablet (325 mg total) by mouth 2 (two) times daily. 01/10/24 02/09/24  Amin, Ankit C, MD  Glucagon (GVOKE HYPOPEN 2-PACK) 1 MG/0.2ML SOAJ Inject 1 pen  into the skin once as needed (For severe hypoglycemia). 05/20/23   [provider]  hydrALAZINE  (APRESOLINE ) 25 MG tablet Take 1 tablet (25 mg total) by mouth 3 (three) times daily. 01/10/24 02/09/24  Amin, Ankit C, MD  insulin  aspart (NOVOLOG ) 100 UNIT/ML injection Inject 300 Units into the skin every 3 (three) days. Insulin  pump    [provider]  Insulin  Infusion Pump (T:SLIM X2 INS PUMP/CONTROL-IQ) DEVI 1 Device by Does not apply route every 3 (three) days. Change site every 3 days    [provider]  lamoTRIgine  (LAMICTAL ) 25 MG tablet Take 2 tablets twice a day Patient taking differently: Take 50 mg by mouth daily. 11/05/23   Jhonny Moss, MD  ondansetron  (ZOFRAN )  4 MG tablet Take 1 tablet (4 mg total) by mouth every 6 (six) hours. Patient taking differently: Take 4 mg by mouth every 8 (eight) hours as needed for vomiting, nausea or refractory nausea / vomiting. 07/09/23   Robinson, John K, PA-C  rosuvastatin  (CRESTOR ) 20 MG tablet Take 20 mg by mouth at bedtime.    [provider]  senna-docusate (SENOKOT-S) 8.6-50 MG tablet Take 1 tablet by mouth at bedtime as needed for moderate constipation. 01/10/24   Amin, Ankit C, MD  sertraline  (ZOLOFT ) 100 MG tablet Take 100 mg by mouth daily.    [provider]  Vitamin D, Ergocalciferol, (DRISDOL) 1.25 MG (50000 UNIT) CAPS capsule Take 50,000 Units by mouth once a week. Tuesday    [provider]      Allergies    Patient has no known allergies.    Review of Systems   Review of Systems  Genitourinary:  Positive for penile swelling. Negative for decreased urine volume and testicular pain.    Physical Exam Updated Vital Signs BP (!) 170/113   Pulse 89   Temp 98 F (36.7 C) (Oral)   Resp 16   SpO2 95%  Physical Exam Vitals and nursing note reviewed. Exam conducted with a chaperone present.  Constitutional:      Appearance: Normal appearance.  HENT:     Head: Atraumatic.  Pulmonary:  Effort: Pulmonary effort is normal.  Genitourinary:    Comments: RN chaperone present for exam  Circumcised penis.  Ring of swelling at the base of the glans of the penis with no overlying skin change.  No edema diffusely of the penis.  No penile discharge  No scrotal swelling or skin changes.  Testicles nontender to palpation. Neurological:     General: No focal deficit present.     Mental Status: He is alert.  Psychiatric:        Mood and Affect: Mood normal.        Behavior: Behavior normal.          ED Results / Procedures / Treatments   Labs (all labs ordered are listed, but only abnormal results are displayed) Labs Reviewed  URINALYSIS, ROUTINE W REFLEX MICROSCOPIC -  Abnormal; Notable for the following components:      Result Value   Color, Urine STRAW (*)    Glucose, UA 150 (*)    Hgb urine dipstick SMALL (*)    Protein, ur >=300 (*)    Bacteria, UA RARE (*)    All other components within normal limits  CBC WITH DIFFERENTIAL/PLATELET - Abnormal; Notable for the following components:   RBC 3.05 (*)    Hemoglobin 8.5 (*)    HCT 26.3 (*)    All other components within normal limits  COMPREHENSIVE METABOLIC PANEL WITH GFR - Abnormal; Notable for the following components:   CO2 14 (*)    Glucose, Bld 134 (*)    BUN 61 (*)    Creatinine, Ser 8.00 (*)    Calcium  7.8 (*)    Total Protein 6.2 (*)    Albumin 3.0 (*)    GFR, Estimated 9 (*)    All other components within normal limits    EKG None  Radiology CT ABDOMEN PELVIS WO CONTRAST Result Date: 01/17/2024 CLINICAL DATA:  Penile edema, testicular swelling. EXAM: CT ABDOMEN AND PELVIS WITHOUT CONTRAST TECHNIQUE: Multidetector CT imaging of the abdomen and pelvis was performed following the standard protocol without IV contrast. RADIATION DOSE REDUCTION: This exam was performed according to the departmental dose-optimization program which includes automated exposure control, adjustment of the mA and/or kV according to patient size and/or use of iterative reconstruction technique. COMPARISON:  Scrotal ultrasound 01/17/2024 FINDINGS: Lower chest: Low-density blood pool suggests anemia. Small to moderate right and small left pleural effusion. Mild secondary pulmonary lobular interstitial accentuation at the lung bases which could be an indicator of mild interstitial pulmonary edema. Passive atelectasis in both lower lobes. Hepatobiliary: Unremarkable Pancreas: Unremarkable Spleen: Adrenals/Urinary Tract: Unremarkable both adrenal glands appear normal. Three speckled calcifications along the right kidney could represent a tiny punctate renal calculi. No hydronephrosis or hydroureter. Current urinary bladder  volume 760 cc. Stomach/Bowel: Unremarkable.  Normal appendix. Vascular/Lymphatic: Scattered small retroperitoneal lymph nodes could be reactive but are not pathologically enlarged. Reproductive: Penoscrotal edema partially observed. Other: Diffuse subcutaneous edema. Low-level mesenteric edema with trace pelvic ascites. Musculoskeletal: Unremarkable IMPRESSION: 1. Right greater than left pleural effusions with diffuse subcutaneous edema, mild ascites and mild mesenteric edema, and penoscrotal edema. Questionable minimal interstitial edema in the lung bases. Appearance favors third spacing of fluid. The patient's hypoproteinemia and hypoalbuminemia may be contributory to oncotic component of Starling force derangement. 2. Low-density blood pool suggests anemia. 3. Three speckled calcifications along the right kidney could represent a tiny punctate renal calculi. No hydronephrosis or hydroureter. 4. Current urinary bladder volume 760 cc. Electronically Signed   By:  Freida Jes M.D.   On: 01/17/2024 18:09   US  SCROTUM W/DOPPLER Result Date: 01/17/2024 CLINICAL DATA:  Testicular pain and swelling EXAM: SCROTAL ULTRASOUND DOPPLER ULTRASOUND OF THE TESTICLES TECHNIQUE: Complete ultrasound examination of the testicles, epididymis, and other scrotal structures was performed. Color and spectral Doppler ultrasound were also utilized to evaluate blood flow to the testicles. COMPARISON:  None Available. FINDINGS: Right testicle Measurements: 4.1 x 2.0 x 3.1 cm. No mass or microlithiasis visualized. Left testicle Measurements: 3.8 x 2.1 x 3.0 cm. No mass or microlithiasis visualized. Right epididymis:  Normal in size and appearance. Left epididymis:  Left epididymal cyst of 5 x 3 x 5 mm Hydrocele:  None visualized. Varicocele:  None visualized. Pulsed Doppler interrogation of both testes demonstrates mild increased vascularity bilaterally with diffuse scrotal wall edema. No evidence of testicular torsion. IMPRESSION: No  evidence of testicular torsion. Nonspecific increased vascularity edema of the scrotal wall bilaterally. Correlate clinically for focal local inflammatory changes and cellulitis Electronically Signed   By: Fredrich Jefferson M.D.   On: 01/17/2024 14:06    Procedures Procedures    Medications Ordered in ED Medications - No data to display  ED Course/ Medical Decision Making/ A&P Clinical Course as of 01/17/24 2216  Mon Jan 17, 2024  1918 Patient was noted to have 760 mL of urine on his CT scan.  Was going to place Foley catheter since patient stated he was not urinating very much and is concerned about urinary retention.  However, he then use the restroom and completely emptied.  Bladder scan was performed after using the restroom and showed 20 mL.  Will not place Foley at this time and have him follow-up with urology within the next week. [AF]    Clinical Course User Index [AF] Rexie Catena, PA-C                                 Medical Decision Making Amount and/or Complexity of Data Reviewed Labs: ordered. Radiology: ordered.     Differential diagnosis includes but is not limited to paraphimosis, edema, STI  ED Course:  Upon initial evaluation, patient is well-appearing, stable vitals aside from elevated blood pressure of 151/103.  GU exam performed with RN chaperone present.  Patient has ring of edema at the base of the glans of the penis, but does not involve the whole penis shaft.  No involvement of the testicles.  No overlying skin change.  I had my attending Dr. Martina Sledge also evaluate patient, and he recommended reaching out to urology for further input.  Labs Ordered: I Ordered, and personally interpreted labs.  The pertinent results include:   Urinalysis without signs of UTI, proteinuria present CBC with low hemoglobin at 8.5, appears to be at baseline CMP with elevated creatinine at 8.0, patient does have ESRD.  Hypoalbuminemia and decreased protein present  Imaging  Studies ordered: I ordered imaging studies including CT abdomen pelvis, scrotal ultrasound I independently visualized the imaging with scope of interpretation limited to determining acute life threatening conditions related to emergency care. Scrotal ultrasound with no evidence of testicular torsion.  Nonspecific increased vascularity edema of the scrotal wall bilaterally.  Left epididymal cyst noted CT abdomen pelvis showed right greater than left pleural effusions, diffuse subcutaneous edema, mild ascites, mild mesenteric edema, and penoscrotal edema. I agree with the radiologist interpretation   Consultations Obtained: I requested consultation with the urology Dr. Cathi Cluster,  and discussed  lab and imaging findings as well as pertinent plan - they recommend:  CT abdomen and pelvis to rule out malignancy  They will see patient in consult  Recommended applying compression to the penis to help with the edema   Upon re-evaluation, patient still well-appearing, stable vitals.  CT scan unremarkable for any malignancy that would explain his swelling.  Given diffuse edema present on scan and his ESRD, suspect this is fluid from third spacing that is causing his penile edema today.  Dr. Cathi Cluster also saw the patient in consult and believes this edema is likely secondary to third spacing.  There was large amount of fluid noted in the bladder on CT scan, but patient was able to void after the scan and completely emptied.  I performed bedside bladder scan after his void which showed 20 mL in the bladder.  No concern for acute urinary retention at this time.  Will discharge home with close follow-up with his nephrologist and urology.    Impression: Penile edema secondary to ESRD third spacing  Disposition:  The patient was discharged home with instructions to follow-up with urology Dr. Ludwig Safer within the next week, his contact information was provided and they understand they need to call and schedule  an appointment.  Attend his appointment with nephrology in 2 days to address his ESRD and hypertension.  Apply gentle compression to the penis to help with swelling. Return precautions given.     This chart was dictated using voice recognition software, Dragon. Despite the best efforts of this provider to proofread and correct errors, errors may still occur which can change documentation meaning.          Final Clinical Impression(s) / ED Diagnoses Final diagnoses:  Penile swelling    Rx / DC Orders ED Discharge Orders          Ordered    Ambulatory referral to Urology        01/17/24 1839              Rexie Catena, PA-C 01/17/24 2216    Teddi Favors, DO 01/20/24 [phone]

## 2024-01-21 ENCOUNTER — Encounter (HOSPITAL_COMMUNITY)
Admission: RE | Admit: 2024-01-21 | Discharge: 2024-01-21 | Disposition: A | Discharge status: Home / Self Care | Source: Ambulatory Visit | Attending: Nephrology | Admitting: Nephrology

## 2024-01-21 DIAGNOSIS — D631 Anemia in chronic kidney disease: Secondary | ICD-10-CM | POA: Diagnosis not present

## 2024-01-21 DIAGNOSIS — N185 Chronic kidney disease, stage 5: Secondary | ICD-10-CM | POA: Insufficient documentation

## 2024-01-21 MED ORDER — IRON SUCROSE 200 MG IVPB - SIMPLE MED
200.0000 mg | Status: DC
  Administered 2024-01-21: 200 mg via INTRAVENOUS
  Filled 2024-01-21: qty 200

## 2024-01-22 ENCOUNTER — Encounter (HOSPITAL_COMMUNITY): Payer: Self-pay | Admitting: Emergency Medicine

## 2024-01-22 ENCOUNTER — Inpatient Hospital Stay (HOSPITAL_COMMUNITY)
Admission: EM | Admit: 2024-01-22 | Discharge: 2024-01-25 | DRG: 682 | Disposition: A | Discharge status: Home / Self Care | Attending: Family Medicine | Admitting: Family Medicine

## 2024-01-22 ENCOUNTER — Emergency Department (HOSPITAL_COMMUNITY)

## 2024-01-22 ENCOUNTER — Other Ambulatory Visit: Payer: Self-pay

## 2024-01-22 DIAGNOSIS — J810 Acute pulmonary edema: Secondary | ICD-10-CM | POA: Diagnosis present

## 2024-01-22 DIAGNOSIS — Z789 Other specified health status: Secondary | ICD-10-CM

## 2024-01-22 DIAGNOSIS — E109 Type 1 diabetes mellitus without complications: Secondary | ICD-10-CM | POA: Insufficient documentation

## 2024-01-22 DIAGNOSIS — D631 Anemia in chronic kidney disease: Secondary | ICD-10-CM | POA: Diagnosis present

## 2024-01-22 DIAGNOSIS — E669 Obesity, unspecified: Secondary | ICD-10-CM | POA: Diagnosis present

## 2024-01-22 DIAGNOSIS — E1022 Type 1 diabetes mellitus with diabetic chronic kidney disease: Secondary | ICD-10-CM | POA: Diagnosis present

## 2024-01-22 DIAGNOSIS — Z9641 Presence of insulin pump (external) (internal): Secondary | ICD-10-CM | POA: Diagnosis present

## 2024-01-22 DIAGNOSIS — Z794 Long term (current) use of insulin: Secondary | ICD-10-CM

## 2024-01-22 DIAGNOSIS — E785 Hyperlipidemia, unspecified: Secondary | ICD-10-CM | POA: Diagnosis present

## 2024-01-22 DIAGNOSIS — J811 Chronic pulmonary edema: Secondary | ICD-10-CM | POA: Diagnosis present

## 2024-01-22 DIAGNOSIS — N4889 Other specified disorders of penis: Secondary | ICD-10-CM

## 2024-01-22 DIAGNOSIS — N189 Chronic kidney disease, unspecified: Principal | ICD-10-CM | POA: Diagnosis present

## 2024-01-22 DIAGNOSIS — I120 Hypertensive chronic kidney disease with stage 5 chronic kidney disease or end stage renal disease: Secondary | ICD-10-CM | POA: Diagnosis present

## 2024-01-22 DIAGNOSIS — R600 Localized edema: Secondary | ICD-10-CM

## 2024-01-22 DIAGNOSIS — N3000 Acute cystitis without hematuria: Secondary | ICD-10-CM

## 2024-01-22 DIAGNOSIS — Z8249 Family history of ischemic heart disease and other diseases of the circulatory system: Secondary | ICD-10-CM

## 2024-01-22 DIAGNOSIS — E876 Hypokalemia: Secondary | ICD-10-CM | POA: Diagnosis present

## 2024-01-22 DIAGNOSIS — G40909 Epilepsy, unspecified, not intractable, without status epilepticus: Secondary | ICD-10-CM | POA: Diagnosis present

## 2024-01-22 DIAGNOSIS — Z833 Family history of diabetes mellitus: Secondary | ICD-10-CM

## 2024-01-22 DIAGNOSIS — R0601 Orthopnea: Secondary | ICD-10-CM | POA: Diagnosis present

## 2024-01-22 DIAGNOSIS — N179 Acute kidney failure, unspecified: Principal | ICD-10-CM | POA: Diagnosis present

## 2024-01-22 DIAGNOSIS — N185 Chronic kidney disease, stage 5: Secondary | ICD-10-CM | POA: Diagnosis present

## 2024-01-22 DIAGNOSIS — E8770 Fluid overload, unspecified: Secondary | ICD-10-CM | POA: Diagnosis present

## 2024-01-22 DIAGNOSIS — Z6830 Body mass index (BMI) 30.0-30.9, adult: Secondary | ICD-10-CM

## 2024-01-22 DIAGNOSIS — Z79899 Other long term (current) drug therapy: Secondary | ICD-10-CM

## 2024-01-22 DIAGNOSIS — N049 Nephrotic syndrome with unspecified morphologic changes: Secondary | ICD-10-CM

## 2024-01-22 DIAGNOSIS — F329 Major depressive disorder, single episode, unspecified: Secondary | ICD-10-CM | POA: Diagnosis present

## 2024-01-22 HISTORY — DX: Chronic kidney disease, stage 4 (severe): N18.4

## 2024-01-22 LAB — URINALYSIS, ROUTINE W REFLEX MICROSCOPIC
Bilirubin Urine: NEGATIVE
Glucose, UA: 150 mg/dL — AB
Hgb urine dipstick: NEGATIVE
Ketones, ur: NEGATIVE mg/dL
Leukocytes,Ua: NEGATIVE
Nitrite: NEGATIVE
Protein, ur: >=300 mg/dL — AB
Specific Gravity, Urine: 1.009 (ref 1.005–1.030)
pH: 5 (ref 5.0–8.0)

## 2024-01-22 LAB — DIFFERENTIAL
Abs Immature Granulocytes: 0.05 10*3/uL (ref 0.00–0.07)
Basophils Absolute: 0.1 10*3/uL (ref 0.0–0.1)
Basophils Relative: 1 %
Eosinophils Absolute: 0.3 10*3/uL (ref 0.0–0.5)
Eosinophils Relative: 3 %
Immature Granulocytes: 1 %
Lymphocytes Relative: 21 %
Lymphs Abs: 1.6 10*3/uL (ref 0.7–4.0)
Monocytes Absolute: 0.8 10*3/uL (ref 0.1–1.0)
Monocytes Relative: 11 %
Neutro Abs: 4.8 10*3/uL (ref 1.7–7.7)
Neutrophils Relative %: 63 %

## 2024-01-22 LAB — CBC
HCT: 25.4 % — ABNORMAL LOW (ref 39.0–52.0)
Hemoglobin: 8 g/dL — ABNORMAL LOW (ref 13.0–17.0)
MCH: 27.4 pg (ref 26.0–34.0)
MCHC: 31.5 g/dL (ref 30.0–36.0)
MCV: 87 fL (ref 80.0–100.0)
Platelets: 291 10*3/uL (ref 150–400)
RBC: 2.92 MIL/uL — ABNORMAL LOW (ref 4.22–5.81)
RDW: 13.2 % (ref 11.5–15.5)
WBC: 7.6 10*3/uL (ref 4.0–10.5)
nRBC: 0 % (ref 0.0–0.2)

## 2024-01-22 LAB — GLUCOSE, CAPILLARY
Glucose-Capillary: 129 mg/dL — ABNORMAL HIGH (ref 70–99)
Glucose-Capillary: 155 mg/dL — ABNORMAL HIGH (ref 70–99)

## 2024-01-22 LAB — COMPREHENSIVE METABOLIC PANEL WITH GFR
ALT: 19 U/L (ref 0–44)
AST: 17 U/L (ref 15–41)
Albumin: 2.9 g/dL — ABNORMAL LOW (ref 3.5–5.0)
Alkaline Phosphatase: 64 U/L (ref 38–126)
Anion gap: 16 — ABNORMAL HIGH (ref 5–15)
BUN: 78 mg/dL — ABNORMAL HIGH (ref 6–20)
CO2: 18 mmol/L — ABNORMAL LOW (ref 22–32)
Calcium: 7.9 mg/dL — ABNORMAL LOW (ref 8.9–10.3)
Chloride: 106 mmol/L (ref 98–111)
Creatinine, Ser: 9.53 mg/dL — ABNORMAL HIGH (ref 0.61–1.24)
GFR, Estimated: 7 mL/min — ABNORMAL LOW (ref >60)
Glucose, Bld: 129 mg/dL — ABNORMAL HIGH (ref 70–99)
Potassium: 3.5 mmol/L (ref 3.5–5.1)
Sodium: 140 mmol/L (ref 135–145)
Total Bilirubin: 0.3 mg/dL (ref 0.0–1.2)
Total Protein: 5.8 g/dL — ABNORMAL LOW (ref 6.5–8.1)

## 2024-01-22 LAB — BRAIN NATRIURETIC PEPTIDE: B Natriuretic Peptide: 928.4 pg/mL — ABNORMAL HIGH (ref 0.0–100.0)

## 2024-01-22 MED ORDER — HEPARIN SODIUM (PORCINE) 5000 UNIT/ML IJ SOLN
5000.0000 [IU] | Freq: Three times a day (TID) | INTRAMUSCULAR | Status: DC
  Administered 2024-01-22 – 2024-01-25 (×8): 5000 [IU] via SUBCUTANEOUS
  Filled 2024-01-22 (×6): qty 1

## 2024-01-22 MED ORDER — IRON SUCROSE 200 MG IVPB - SIMPLE MED: 200.0000 mg | Status: DC

## 2024-01-22 MED ORDER — CALCITRIOL 0.25 MCG PO CAPS
0.2500 ug | ORAL_CAPSULE | Freq: Every day | ORAL | Status: DC
  Administered 2024-01-22 – 2024-01-25 (×4): 0.25 ug via ORAL
  Filled 2024-01-22 (×5): qty 1

## 2024-01-22 MED ORDER — FERROUS SULFATE 325 (65 FE) MG PO TBEC
325.0000 mg | DELAYED_RELEASE_TABLET | Freq: Two times a day (BID) | ORAL | Status: DC
Start: 2024-01-22 — End: 2024-01-22
  Filled 2024-01-22: qty 1

## 2024-01-22 MED ORDER — FUROSEMIDE 10 MG/ML IJ SOLN
80.0000 mg | Freq: Once | INTRAMUSCULAR | Status: AC
  Administered 2024-01-22: 80 mg via INTRAVENOUS
  Filled 2024-01-22: qty 8

## 2024-01-22 MED ORDER — FERROUS SULFATE 325 (65 FE) MG PO TABS
325.0000 mg | ORAL_TABLET | Freq: Two times a day (BID) | ORAL | Status: DC
  Administered 2024-01-22 – 2024-01-25 (×6): 325 mg via ORAL
  Filled 2024-01-22 (×6): qty 1

## 2024-01-22 MED ORDER — LAMOTRIGINE 25 MG PO TABS
50.0000 mg | ORAL_TABLET | Freq: Every day | ORAL | Status: DC
  Administered 2024-01-22 – 2024-01-23 (×2): 50 mg via ORAL
  Filled 2024-01-22 (×3): qty 2

## 2024-01-22 MED ORDER — FUROSEMIDE 10 MG/ML IJ SOLN
100.0000 mg | Freq: Three times a day (TID) | INTRAVENOUS | Status: DC
  Administered 2024-01-22 – 2024-01-23 (×3): 100 mg via INTRAVENOUS
  Filled 2024-01-22 (×10): qty 10

## 2024-01-22 MED ORDER — AMLODIPINE BESYLATE 10 MG PO TABS
10.0000 mg | ORAL_TABLET | Freq: Every day | ORAL | Status: DC
  Administered 2024-01-22 – 2024-01-25 (×4): 10 mg via ORAL
  Filled 2024-01-22 (×4): qty 1

## 2024-01-22 MED ORDER — SERTRALINE HCL 100 MG PO TABS
100.0000 mg | ORAL_TABLET | Freq: Every day | ORAL | Status: DC
  Administered 2024-01-22 – 2024-01-25 (×4): 100 mg via ORAL
  Filled 2024-01-22 (×4): qty 1

## 2024-01-22 MED ORDER — ACETAMINOPHEN 325 MG PO TABS: 650.0000 mg | ORAL_TABLET | Freq: Four times a day (QID) | ORAL | Status: DC | PRN

## 2024-01-22 MED ORDER — ORAL CARE MOUTH RINSE: 15.0000 mL | OROMUCOSAL | Status: DC | PRN

## 2024-01-22 MED ORDER — CARVEDILOL 12.5 MG PO TABS
12.5000 mg | ORAL_TABLET | Freq: Two times a day (BID) | ORAL | Status: DC
  Administered 2024-01-22 – 2024-01-25 (×7): 12.5 mg via ORAL
  Filled 2024-01-22 (×7): qty 1

## 2024-01-22 MED ORDER — ACETAMINOPHEN 650 MG RE SUPP: 650.0000 mg | Freq: Four times a day (QID) | RECTAL | Status: DC | PRN

## 2024-01-22 MED ORDER — ROSUVASTATIN CALCIUM 20 MG PO TABS
20.0000 mg | ORAL_TABLET | Freq: Every day | ORAL | Status: DC
  Administered 2024-01-22 – 2024-01-24 (×3): 20 mg via ORAL
  Filled 2024-01-22 (×3): qty 1

## 2024-01-22 MED ORDER — INSULIN PUMP
Freq: Three times a day (TID) | SUBCUTANEOUS | Status: DC
  Administered 2024-01-22: 0.5 via SUBCUTANEOUS
  Administered 2024-01-23: 3 via SUBCUTANEOUS
  Administered 2024-01-23: 1.5 via SUBCUTANEOUS
  Administered 2024-01-24: 3 via SUBCUTANEOUS
  Administered 2024-01-25: 2 via SUBCUTANEOUS
  Filled 2024-01-22: qty 1

## 2024-01-22 MED ORDER — HYDRALAZINE HCL 25 MG PO TABS
25.0000 mg | ORAL_TABLET | Freq: Three times a day (TID) | ORAL | Status: DC
Start: 2024-01-22 — End: 2024-01-25
  Administered 2024-01-22 – 2024-01-25 (×9): 25 mg via ORAL
  Filled 2024-01-22 (×9): qty 1

## 2024-01-22 MED ORDER — SODIUM CHLORIDE 0.9 % IV SOLN
1.0000 g | Freq: Once | INTRAVENOUS | Status: AC
  Administered 2024-01-22: 1 g via INTRAVENOUS
  Filled 2024-01-22: qty 10

## 2024-01-22 NOTE — Plan of Care (Addendum)
 FMTS Brief Progress Note  S: To bedside for night rounds.  Patient denies concerns at this time.  Does not feel like his swelling has gone down much but he has been able to urinate some.  Denies difficulty breathing or significant pain..   O: BP 135/80 (BP Location: Right Arm)   Pulse 85   Temp 98.1 F (36.7 C) (Oral)   Resp 18   Ht 5\' 7"  (1.702 m)   Wt 86.4 kg   SpO2 92%   BMI 29.83 kg/m    General: NAD, pleasant, able to participate in exam CV: RRR no murmur Respiratory: CTAB normal WOB on RA Skin: warm and dry, no rashes noted Psych: Normal affect and mood  A/P:  Penile edema, CKD 4, volume overload Most recent bladder scan <250cc, voided shortly thereafter Stable overall - Aggressive diuresis per day team and nephrology - Remainder of plan per today's H&P   - Orders reviewed. Labs for AM ordered, which was adjusted as needed.  - If condition changes, plan includes page primary team.   Edison Gore, MD 01/22/2024, 9:26 PM PGY-2, [facility] Night Resident  Please page [phone] with questions.

## 2024-01-22 NOTE — Plan of Care (Signed)
  Problem: Education: Goal: Ability to describe self-care measures that may prevent or decrease complications (Diabetes Survival Skills Education) will improve Outcome: Progressing Goal: Individualized Educational Video(s) Outcome: Progressing   Problem: Coping: Goal: Ability to adjust to condition or change in health will improve Outcome: Progressing   Problem: Fluid Volume: Goal: Ability to maintain a balanced intake and output will improve Outcome: Progressing   Problem: Health Behavior/Discharge Planning: Goal: Ability to identify and utilize available resources and services will improve Outcome: Progressing Goal: Ability to manage health-related needs will improve Outcome: Progressing   Problem: Metabolic: Goal: Ability to maintain appropriate glucose levels will improve Outcome: Progressing   Problem: Metabolic: Goal: Ability to maintain appropriate glucose levels will improve Outcome: Progressing   Problem: Nutritional: Goal: Maintenance of adequate nutrition will improve Outcome: Progressing Goal: Progress toward achieving an optimal weight will improve Outcome: Progressing   Problem: Skin Integrity: Goal: Risk for impaired skin integrity will decrease Outcome: Progressing   Problem: Tissue Perfusion: Goal: Adequacy of tissue perfusion will improve Outcome: Progressing

## 2024-01-22 NOTE — ED Notes (Signed)
 Nephrology at the bedside.

## 2024-01-22 NOTE — Assessment & Plan Note (Addendum)
 Penile edema and generalized edema likely secondary to low oncotic pressures with CKD 4.  Nephrology following.  Patient has been able to void successfully.  Low concern for obstruction, will assess with bladder scans. S/p CTX in ED, low concern for infectious etiology at this time. - Admit to FMTS with attending Dr Dawn Eth - Appreciate nephrology recommendations - Continue IV Lasix infusion - Continue home calcitriol , ferrous sulfate  - Tylenol  as needed - Bladder scans every 8 - Strict I/O's - Vitals per floor - AM BMP

## 2024-01-22 NOTE — Plan of Care (Signed)
   Problem: Education: Goal: Ability to describe self-care measures that may prevent or decrease complications (Diabetes Survival Skills Education) will improve Outcome: Progressing

## 2024-01-22 NOTE — Hospital Course (Signed)
 Stephen Prince is a 28 y.o. year old with a history of CKD, T1DM, epilepsy, HTN, HLD, MDD who presented with generalized edema and was admitted to the [facility] Medicine Teaching service for AKI in the setting of volume overload.  AKI on CKD 5  Penile edema Patient presented with generalized edema worst in the penile region suspected 2/2 low oncotic pressure.  Creatinine with persistent uptrend throughout 2024, with baseline 6-7, but elevated to 9.53 on admission.  Nephrology consulted on admission and patient was started on 100 mg Lasix IV Q8h, which was subsequently increased to 120 mg the following day.  Creatinine remained at 9.3 at time of discharge, but patient had no evidence of uremia and had good UOP with 8.1 L net negative output for the admission.  Penile edema improved with diuresis and patient discharged with torsemide 40 mg BID at baseline volume status, dry weight of 83.2 kg.  Nephrology also recommended 40 mEq KCl daily at discharge.  Epilepsy Switched lamotrigine  50 mg from daily to BID on admission per Neurology note, dispense records, and patient reports.  Monitored closely for rash, discharged on new regimen.  Other chronic conditions were medically managed with home medications and formulary alternatives as necessary (HTN, HLD, MDD, T1DM).  PCP Follow-up Recommendations: Recommend repeat BMP in 1 week to evaluate creatinine Monitor volume status and weight on torsemide, evaluate for signs of uremia or decreasing UOP, which would be indications for HD Follow up A1c

## 2024-01-22 NOTE — Assessment & Plan Note (Deleted)
 On insulin  pump and CGM at home. - -

## 2024-01-22 NOTE — ED Triage Notes (Signed)
 Pt arrives via POV from home with groin swelling for the last week. Pt also states having difficulty urinating, only dribbling small amounts of urine. Pt reports CKD stage 4, some SOB. Not on dialysis yet.

## 2024-01-22 NOTE — Assessment & Plan Note (Addendum)
 Seizures: Cont home Lamotrigine  50 daily T1DM: Continue home insulin  pump and CGM. HTN: Continue home amlodipine  10 daily, carvedilol  12.5 twice daily, hydralazine  25 3 times daily HLD: Continue home rosuvastatin  20 daily Mood: Continue home Zoloft  100 daily

## 2024-01-22 NOTE — ED Notes (Signed)
 Nt called CCMD to put pt on monitor

## 2024-01-22 NOTE — ED Notes (Signed)
 Patient transported to CT

## 2024-01-22 NOTE — ED Notes (Signed)
 Urine collected and sent to lab.

## 2024-01-22 NOTE — ED Provider Notes (Signed)
 [place] EMERGENCY DEPARTMENT AT [facility] Provider Note  CSN: [id number] Arrival date & time: 01/22/24 2542  Chief Complaint(s) Groin Swelling and Dysuria  HPI Stephen Prince is a 28 y.o. male with past medical history as below, significant for CKD, DM1, seizure who presents to the ED with complaint of fluid overload, groin swelling, dysuria.  History of CKD, follows with [facility].  Reports worsening abdomen, leg and groin swelling over the past week approximately 12 pound weight gain.  He was seen by nephrology yesterday started on torsemide.  He is having difficulty breathing with exertion and orthopnea.  Worsening edema.  Difficulty urinating secondary to scrotal edema and penile edema, some pain with urination.  No nausea or vomiting.  No fevers or chills.  Past Medical History Past Medical History:  Diagnosis Date   CKD (chronic kidney disease) stage 4, GFR 15-29 ml/min (HCC)    Diabetes mellitus without complication [facility])    Patient Active Problem List   Diagnosis Date Noted   AKI (acute kidney injury) (HCC) 01/07/2024   Seizure (HCC) 05/12/2023   Hypoglycemia associated with diabetes (HCC) 05/12/2023   Home Medication(s) Prior to Admission medications   Medication Sig Start Date End Date Taking? Authorizing Provider  amLODipine  (NORVASC ) 10 MG tablet Take 1 tablet (10 mg total) by mouth daily. 01/11/24  Yes Amin, Ankit C, MD  calcitRIOL  (ROCALTROL ) 0.25 MCG capsule Take 0.25 mcg by mouth daily. 01/04/24  Yes [provider]  carvedilol  (COREG ) 12.5 MG tablet Take 1 tablet (12.5 mg total) by mouth 2 (two) times daily. 01/10/24  Yes Amin, Ankit C, MD  Continuous Glucose Sensor (DEXCOM G7 SENSOR) MISC Inject 1 application  into the skin See admin instructions. Every 10 days 01/19/24  Yes [provider]  ferrous sulfate  325 (65 FE) MG EC tablet Take 1 tablet (325 mg total) by mouth 2 (two) times daily. 01/10/24 02/09/24 Yes Amin, Ankit C, MD   furosemide (LASIX) 40 MG tablet Take 40 mg by mouth daily as needed. 01/17/24  Yes [provider]  Glucagon (GVOKE HYPOPEN 2-PACK) 1 MG/0.2ML SOAJ Inject 1 pen  into the skin once as needed (For severe hypoglycemia). 05/20/23  Yes [provider]  hydrALAZINE  (APRESOLINE ) 25 MG tablet Take 1 tablet (25 mg total) by mouth 3 (three) times daily. 01/10/24 02/09/24 Yes Amin, Ankit C, MD  insulin  aspart (NOVOLOG ) 100 UNIT/ML injection Inject 60 Units into the skin daily. Insulin  pump   Yes [provider]  lamoTRIgine  (LAMICTAL ) 25 MG tablet Take 2 tablets twice a day Patient taking differently: Take 50 mg by mouth daily. 11/05/23  Yes Jhonny Moss, MD  ondansetron  (ZOFRAN ) 4 MG tablet Take 1 tablet (4 mg total) by mouth every 6 (six) hours. Patient taking differently: Take 4 mg by mouth every 8 (eight) hours as needed for vomiting, nausea or refractory nausea / vomiting. 07/09/23  Yes Robinson, John K, PA-C  rosuvastatin  (CRESTOR ) 20 MG tablet Take 20 mg by mouth at bedtime.   Yes [provider]  senna-docusate (SENOKOT-S) 8.6-50 MG tablet Take 1 tablet by mouth at bedtime as needed for moderate constipation. 01/10/24  Yes Amin, Ankit C, MD  sertraline  (ZOLOFT ) 100 MG tablet Take 100 mg by mouth daily.   Yes [provider]  torsemide (DEMADEX) 20 MG tablet Take 40 mg by mouth 2 (two) times daily. 01/19/24  Yes [provider]  Vitamin D, Ergocalciferol, (DRISDOL) 1.25 MG (50000 UNIT) CAPS capsule Take 50,000  Units by mouth once a week. Tuesday   Yes [provider]  Insulin  Infusion Pump (T:SLIM X2 INS PUMP/CONTROL-IQ) DEVI 1 Device by Does not apply route every 3 (three) days. Change site every 3 days    [provider]  losartan  (COZAAR ) 100 MG tablet Take 100 mg by mouth daily. Patient not taking: Reported on 01/22/2024    [provider]                                                                                                                                     Past Surgical History History reviewed. No pertinent surgical history. Family History Family History  Problem Relation Age of Onset   Diabetes Mother    Hypertension Father     Social History Social History   Tobacco Use   Smoking status: Never   Smokeless tobacco: Never  Vaping Use   Vaping status: Never Used  Substance Use Topics   Alcohol use: Never   Drug use: Never   Allergies Patient has no known allergies.  Review of Systems A thorough review of systems was obtained and all systems are negative except as noted in the HPI and PMH.   Physical Exam Vital Signs  I have reviewed the triage vital signs BP (!) 160/101   Pulse 86   Temp (!) 97.5 F (36.4 C) (Oral)   Resp 15   Ht 5\' 7"  (1.702 m)   Wt 88.5 kg   SpO2 99%   BMI 30.54 kg/m  Physical Exam Vitals and nursing note reviewed.  Constitutional:      General: He is not in acute distress.    Appearance: He is well-developed.  HENT:     Head: Normocephalic and atraumatic.     Right Ear: External ear normal.     Left Ear: External ear normal.     Mouth/Throat:     Mouth: Mucous membranes are moist.  Eyes:     General: No scleral icterus. Cardiovascular:     Rate and Rhythm: Normal rate and regular rhythm.     Pulses: Normal pulses.     Heart sounds: Normal heart sounds.  Pulmonary:     Effort: Pulmonary effort is normal. No respiratory distress.     Breath sounds: Normal breath sounds.  Abdominal:     General: Abdomen is flat. There is distension.     Palpations: Abdomen is soft.     Tenderness: There is abdominal tenderness.  Musculoskeletal:     Cervical back: No rigidity.     Right lower leg: Edema present.     Left lower leg: Edema present.  Skin:    General: Skin is warm and dry.     Capillary Refill: Capillary refill takes less than 2 seconds.  Neurological:     Mental Status: He is alert.  Psychiatric:        Mood and Affect: Mood normal.  Behavior: Behavior normal.     ED Results and Treatments Labs (all labs ordered are listed, but only abnormal results are displayed) Labs Reviewed  CBC - Abnormal; Notable for the following components:      Result Value   RBC 2.92 (*)    Hemoglobin 8.0 (*)    HCT 25.4 (*)    All other components within normal limits  COMPREHENSIVE METABOLIC PANEL WITH GFR - Abnormal; Notable for the following components:   CO2 18 (*)    Glucose, Bld 129 (*)    BUN 78 (*)    Creatinine, Ser 9.53 (*)    Calcium  7.9 (*)    Total Protein 5.8 (*)    Albumin 2.9 (*)    GFR, Estimated 7 (*)    Anion gap 16 (*)    All other components within normal limits  BRAIN NATRIURETIC PEPTIDE - Abnormal; Notable for the following components:   B Natriuretic Peptide 928.4 (*)    All other components within normal limits  URINALYSIS, ROUTINE W REFLEX MICROSCOPIC - Abnormal; Notable for the following components:   Color, Urine STRAW (*)    APPearance HAZY (*)    Glucose, UA 150 (*)    Protein, ur >=300 (*)    Bacteria, UA RARE (*)    All other components within normal limits  URINE CULTURE  DIFFERENTIAL                                                                                                                          Radiology CT ABDOMEN PELVIS WO CONTRAST Result Date: 01/22/2024 CLINICAL DATA:  Kidney failure.  Swelling. EXAM: CT ABDOMEN AND PELVIS WITHOUT CONTRAST TECHNIQUE: Multidetector CT imaging of the abdomen and pelvis was performed following the standard protocol without IV contrast. RADIATION DOSE REDUCTION: This exam was performed according to the departmental dose-optimization program which includes automated exposure control, adjustment of the mA and/or kV according to patient size and/or use of iterative reconstruction technique. COMPARISON:  01/17/2024 FINDINGS: Lower chest: Mild interstitial edema. Persistent bilateral pleural effusions, right greater than left. Bilateral lower lobe  atelectasis and ground-glass attenuation overlies the pleural effusions. Hepatobiliary: No focal liver abnormality identified. Gallbladder appears normal. No bile duct dilatation. Pancreas: Unremarkable. No pancreatic ductal dilatation or surrounding inflammatory changes. Spleen: Normal in size without focal abnormality. Adrenals/Urinary Tract: There are 2 punctate stones within the upper pole of the right kidney measuring around 2 mm. No left renal calculi. No kidney mass or obstructive uropathy. Mild bilateral perinephric fat stranding. Urinary bladder appears normal. Stomach/Bowel: Stomach appears within normal limits. The appendix is visualized and appears normal. No pathologic dilatation of the large or small bowel loops to suggest an obstruction. No bowel wall thickening or inflammation. Vascular/Lymphatic: Normal appearance of the abdominal aorta. Prominent retroperitoneal lymph nodes are identified measuring up to 9 mm short axis. No pathologic adenopathy within the abdomen or pelvis. There is diffuse edema within the small bowel mesentery. Reproductive: Prostate gland  appears normal. Diffuse scrotal edema is identified no discrete fluid collections identified. There is also diffuse edema involving the penis. Other: Small volume of free fluid within the dependent portion of the pelvis, image 73/3. Mild perihepatic ascites overlying the anterior dome. No focal fluid collections noted. No signs of pneumoperitoneum. Musculoskeletal: There is visualized osseous structures are unremarkable. Diffuse body wall edema and edema involving the subcutaneous soft tissues of the imaged portions of the lower extremities. IMPRESSION: 1. Persistent bilateral pleural effusions, right greater than left, with pulmonary edema. 2. Diffuse body wall edema and edema involving the subcutaneous soft tissues of the imaged portions of the lower extremities. 3. Diffuse scrotal edema and edema involving the penis. 4. Small volume of free  fluid within the dependent portion of the pelvis. Mild perihepatic ascites overlying the anterior dome. 5. Nonobstructing right renal calculi. Electronically Signed   By: Kimberley Penman M.D.   On: 01/22/2024 09:26   DG Chest Portable 1 View Result Date: 01/22/2024 CLINICAL DATA:  Evaluate for edema. EXAM: PORTABLE CHEST 1 VIEW COMPARISON:  None Available. FINDINGS: Heart size is normal. Small pleural effusions and mild interstitial edema identified. Atelectasis noted in the lung bases. No consolidative changes. Visualized osseous structures are unremarkable. IMPRESSION: Small pleural effusions and mild interstitial edema. Electronically Signed   By: Kimberley Penman M.D.   On: 01/22/2024 08:52    Pertinent labs & imaging results that were available during my care of the patient were reviewed by me and considered in my medical decision making (see MDM for details).  Medications Ordered in ED Medications  furosemide (LASIX) 100 mg in dextrose 5 % 50 mL IVPB (has no administration in time range)  furosemide (LASIX) injection 80 mg (80 mg Intravenous Given 01/22/24 0759)  cefTRIAXone (ROCEPHIN) 1 g in sodium chloride  0.9 % 100 mL IVPB (1 g Intravenous New Bag/Given 01/22/24 1055)                                                                                                                                     Procedures .Critical Care  Performed by: Teddi Favors, DO Authorized by: Teddi Favors, DO   Critical care provider statement:    Critical care time (minutes):  42   Critical care time was exclusive of:  Separately billable procedures and treating other patients   Critical care was necessary to treat or prevent imminent or life-threatening deterioration of the following conditions:  Renal failure   Critical care was time spent personally by me on the following activities:  Development of treatment plan with patient or surrogate, discussions with consultants, evaluation of patient's response  to treatment, examination of patient, ordering and review of laboratory studies, ordering and review of radiographic studies, ordering and performing treatments and interventions, pulse oximetry, re-evaluation of patient's condition, review of old charts and obtaining history from patient or surrogate   Care discussed with: admitting provider     (  including critical care time)  Medical Decision Making / ED Course    Medical Decision Making:    MARKIS HOWERY is a 28 y.o. male with past medical history as below, significant for CKD, DM1, seizure who presents to the ED with complaint of fluid overload, groin swelling, dysuria.. The complaint involves an extensive differential diagnosis and also carries with it a high risk of complications and morbidity.  Serious etiology was considered. Ddx includes but is not limited to: Renal failure, cardiac syndrome, nephritic syndrome, infection, metabolic derangement, liver disease, pulmonary edema, etc.  Complete initial physical exam performed, notably the patient was in stress, sitting comfortably in stretcher, he does appear acutely volume overloaded.    Reviewed and confirmed nursing documentation for past medical history, family history, social history.  Vital signs reviewed.     Brief summary:  28 year old male history of CKD, DM1 here with difficulty urinating, peripheral edema, groin swelling. Gradually worsening fluid overload over the past week.  Unexpected weight changes.  He takes torsemide at home.  Saw nephrology yesterday   Clinical Course as of 01/22/24 1132  Sat Jan 22, 2024  1003 Spoke w/ Dr Zana Hesselbach, will come see [SG]  1049 Progressive volume overload, weight gain, pleural eff, pulm edema, pelvic / scrotal/ le edema. Given lasix. Cr elev from prior, bicarb is also low, K stable fortunately. He is having orthopnea and exertional dyspnea. Given lasix. Renal to eval. Plan for admission [SG]  1051 WBC, UA: 11-20 [SG]  1051 Bacteria,  UA(!): RARE Having dysuria, send culture, will add rocephin [SG]  1131 Was able to urinate after initial dose of lasix [SG]    Clinical Course User Index [SG] Teddi Favors, DO     Patient appears to have progression of his CKD, nephrotic syndrome.  Continue diuresis.  Discussed with nephrology Dr. Jonah Negus who will see in consult.  Started on Lasix infusion.  Echo admission.  Patient is agreeable.  Admit to [facility]               Additional history obtained: -Additional history obtained from family -External records from outside source obtained and reviewed including: Chart review including previous notes, labs, imaging, consultation notes including  Prior labs Prior er eval    Lab Tests: -I ordered, reviewed, and interpreted labs.   The pertinent results include:   Labs Reviewed  CBC - Abnormal; Notable for the following components:      Result Value   RBC 2.92 (*)    Hemoglobin 8.0 (*)    HCT 25.4 (*)    All other components within normal limits  COMPREHENSIVE METABOLIC PANEL WITH GFR - Abnormal; Notable for the following components:   CO2 18 (*)    Glucose, Bld 129 (*)    BUN 78 (*)    Creatinine, Ser 9.53 (*)    Calcium  7.9 (*)    Total Protein 5.8 (*)    Albumin 2.9 (*)    GFR, Estimated 7 (*)    Anion gap 16 (*)    All other components within normal limits  BRAIN NATRIURETIC PEPTIDE - Abnormal; Notable for the following components:   B Natriuretic Peptide 928.4 (*)    All other components within normal limits  URINALYSIS, ROUTINE W REFLEX MICROSCOPIC - Abnormal; Notable for the following components:   Color, Urine STRAW (*)    APPearance HAZY (*)    Glucose, UA 150 (*)    Protein, ur >=300 (*)    Bacteria, UA  RARE (*)    All other components within normal limits  URINE CULTURE  DIFFERENTIAL    Notable for as above, renal disease progression  EKG   EKG Interpretation Date/Time:    Ventricular Rate:    PR Interval:    QRS Duration:    QT  Interval:    QTC Calculation:   R Axis:      Text Interpretation:           Imaging Studies ordered: I ordered imaging studies including cxr ctap I independently visualized the following imaging with scope of interpretation limited to determining acute life threatening conditions related to emergency care; findings noted above I agree with the radiologist interpretation If any imaging was obtained with contrast I closely monitored patient for any possible adverse reaction a/w contrast administration in the emergency department   Medicines ordered and prescription drug management: Meds ordered this encounter  Medications   furosemide (LASIX) injection 80 mg   cefTRIAXone (ROCEPHIN) 1 g in sodium chloride  0.9 % 100 mL IVPB    Antibiotic Indication::   UTI   furosemide (LASIX) 100 mg in dextrose 5 % 50 mL IVPB    -I have reviewed the patients home medicines and have made adjustments as needed   Consultations Obtained: I requested consultation with the renal dr Zana Hesselbach,  and discussed lab and imaging findings as well as pertinent plan - they recommend: will see in eval   Cardiac Monitoring: The patient was maintained on a cardiac monitor.  I personally viewed and interpreted the cardiac monitored which showed an underlying rhythm of: nsr Continuous pulse oximetry interpreted by myself, 98% on RA.    Social Determinants of Health:  Diagnosis or treatment significantly limited by social determinants of health: obesity   Reevaluation: After the interventions noted above, I reevaluated the patient and found that they have improved  Co morbidities that complicate the patient evaluation  Past Medical History:  Diagnosis Date   CKD (chronic kidney disease) stage 4, GFR 15-29 ml/min (HCC)    Diabetes mellitus without complication (HCC)       Dispostion: Disposition decision including need for hospitalization was considered, and patient admitted to the hospital.    Final  Clinical Impression(s) / ED Diagnoses Final diagnoses:  Peripheral edema  Nephrotic syndrome  Acute renal failure superimposed on chronic kidney disease, unspecified acute renal failure type, unspecified CKD stage (HCC)  Acute cystitis without hematuria        Teddi Favors, DO 01/22/24 1133

## 2024-01-22 NOTE — Consult Note (Addendum)
 Renal Service Consult Note [facility]  Stephen Prince 01/22/2024 Lynae Sandifer, MD Requesting Physician: Dr. Martina Sledge  Reason for Consult: Renal failure HPI: The patient is a 28 y.o. year-old w/ PMH as below who presented to ED this morning, history of CKD stage IV presenting with swelling of the legs scrotum and upper extremities and difficulty urinating.  Patient is not on dialysis yet, he is followed by Dr. Zelda Hickman at [facility].  In the ED blood pressure 160/80, HR 86, RR 16, afebrile.  On room air.  BUN 78, creatinine 9.5, K+ 3.5, albumin 2.9, Hgb 8.0, BNP 928.  Chest x-ray shows vascular congestion and possibly early interstitial edema.  Patient received IV Lasix 80 mg and IV Rocephin in the ED.  Patient admitted.  We are asked to see for renal failure.   Pt seen in ED room.  Patient states he was in the hospital to 2 to 3 weeks ago over at [facility], and they gave him fluids and had stopped his diuretics.  He started swelling up over the last week and saw his kidney doctor 2 days ago who started him back on diuretics with torsemide 40 mg twice a day.  Prior to that he was on Lasix.  He is not peeing a whole lot so far except when he got the IV Lasix here he is having a good response.  He does report shortness of breath.  Patient is followed by Dr. Zelda Hickman at [facility], they have talked about dialysis but he has no access surgeries as of yet.  He is also hoping for kidney transplant, working with [facility].   ROS - denies CP, no joint pain, no HA, no blurry vision, no rash, no diarrhea, no nausea/ vomiting  PMH: Diabetes type 1 CKD stage IV  Past Surgical History History reviewed. No pertinent surgical history. Family History  Family History  Problem Relation Age of Onset   Diabetes Mother    Hypertension Father    Social History  reports that he has never smoked. He has never used smokeless tobacco. He reports that he does not drink alcohol and  does not use drugs. Allergies No Known Allergies Home medications Prior to Admission medications   Medication Sig Start Date End Date Taking? Authorizing Provider  amLODipine  (NORVASC ) 10 MG tablet Take 1 tablet (10 mg total) by mouth daily. 01/11/24  Yes Amin, Ankit C, MD  calcitRIOL  (ROCALTROL ) 0.25 MCG capsule Take 0.25 mcg by mouth daily. 01/04/24  Yes [provider]  carvedilol  (COREG ) 12.5 MG tablet Take 1 tablet (12.5 mg total) by mouth 2 (two) times daily. 01/10/24  Yes Amin, Ankit C, MD  Continuous Glucose Sensor (DEXCOM G7 SENSOR) MISC Inject 1 application  into the skin See admin instructions. Every 10 days 01/19/24  Yes [provider]  ferrous sulfate  325 (65 FE) MG EC tablet Take 1 tablet (325 mg total) by mouth 2 (two) times daily. 01/10/24 02/09/24 Yes Amin, Ankit C, MD  furosemide (LASIX) 40 MG tablet Take 40 mg by mouth daily as needed. 01/17/24  Yes [provider]  Glucagon (GVOKE HYPOPEN 2-PACK) 1 MG/0.2ML SOAJ Inject 1 pen  into the skin once as needed (For severe hypoglycemia). 05/20/23  Yes [provider]  hydrALAZINE  (APRESOLINE ) 25 MG tablet Take 1 tablet (25 mg total) by mouth 3 (three) times daily. 01/10/24 02/09/24 Yes Amin, Ankit C, MD  insulin  aspart (NOVOLOG ) 100 UNIT/ML injection Inject 60 Units into the skin  daily. Insulin  pump   Yes [provider]  lamoTRIgine  (LAMICTAL ) 25 MG tablet Take 2 tablets twice a day Patient taking differently: Take 50 mg by mouth daily. 11/05/23  Yes Jhonny Moss, MD  ondansetron  (ZOFRAN ) 4 MG tablet Take 1 tablet (4 mg total) by mouth every 6 (six) hours. Patient taking differently: Take 4 mg by mouth every 8 (eight) hours as needed for vomiting, nausea or refractory nausea / vomiting. 07/09/23  Yes Robinson, John K, PA-C  rosuvastatin  (CRESTOR ) 20 MG tablet Take 20 mg by mouth at bedtime.   Yes [provider]  senna-docusate (SENOKOT-S) 8.6-50 MG tablet Take 1 tablet by mouth at bedtime  as needed for moderate constipation. 01/10/24  Yes Amin, Ankit C, MD  sertraline  (ZOLOFT ) 100 MG tablet Take 100 mg by mouth daily.   Yes [provider]  torsemide (DEMADEX) 20 MG tablet Take 40 mg by mouth 2 (two) times daily. 01/19/24  Yes [provider]  Vitamin D, Ergocalciferol, (DRISDOL) 1.25 MG (50000 UNIT) CAPS capsule Take 50,000 Units by mouth once a week. Tuesday   Yes [provider]  Insulin  Infusion Pump (T:SLIM X2 INS PUMP/CONTROL-IQ) DEVI 1 Device by Does not apply route every 3 (three) days. Change site every 3 days    [provider]  losartan  (COZAAR ) 100 MG tablet Take 100 mg by mouth daily. Patient not taking: Reported on 01/22/2024    [provider]     Vitals:   01/22/24 1610 01/22/24 0715 01/22/24 0930 01/22/24 1025  BP: (!) 161/86   (!) 160/101  Pulse: 95  85 86  Resp: 16  19 15   Temp: 98.3 F (36.8 C)   (!) 97.5 F (36.4 C)  TempSrc:    Oral  SpO2: 98%  94% 99%  Weight:  88.5 kg    Height:  5\' 7"  (1.702 m)     Exam Gen alert, no distress, on room air No rash, cyanosis or gangrene Sclera anicteric, throat clear  No jvd or bruits Chest clear bilat to bases, no rales/ wheezing RRR no MRG Abd soft ntnd no mass or ascites +bs GU swollen penis and scrotum 2+ MS no joint effusions or deformity Ext diffuse 2+ lower extremity and upper extremity edema Neuro is alert, Ox 3 , nf      Renal-related home meds: Norvasc  10 daily  Coreg  12.5 twice daily Lasix 40 mg daily as needed Hydralazine  25 3 times daily Losartan  100 mg daily Torsemide 40 mg twice daily Others: Zoloft , Crestor , Lamictal , insulin , Rocaltrol   Date   Creat  eGFR (ml/min) Aug 2024  2.57- 4.11 07/09/23  3.08 April 25- 28, 2025 7.33 >> 6.62 10- 11 ml/min 01/17/24  8.00  9 01/22/24  9.53  7   CT abd --> normal appearing kidneys w/o hydronephrosis   Assessment/ Plan: AKI on CKD 5: b/l creat 6.6- 7.3 from April 2025. Creat here is 9.5 in the  setting of significant volume overload and mild edema on CXR.  No signs of sig uremia and no need at this time for dialysis. Agree w/ admission for diuresis w/ high-dose IV lasix. Will follow.  Volume overload: diffuse edema, was taken off diuretics during recent admission 2-3 wks ago, and was just restarted on them 2 days ago. Will see how he does w/ IV lasix.  Type 1 DM: per pmd      Larry Poag  MD [facility] 01/22/2024, 11:11 AM  Recent Labs  Lab 01/17/24 1718 01/22/24  0710  HGB 8.5* 8.0*  ALBUMIN 3.0* 2.9*  CALCIUM  7.8* 7.9*  CREATININE 8.00* 9.53*  K 3.5 3.5   Inpatient medications:   cefTRIAXone (ROCEPHIN)  IV 1 g (01/22/24 1055)

## 2024-01-22 NOTE — Progress Notes (Signed)
 New Admission Note:   Arrival Method: stretcher Mental Orientation: aa+ox4 Telemetry: n/a Assessment: Completed Skin: c/d/i IV: right arm SL Pain: denies Tubes: insulin  pump to left lower back Safety Measures: Safety Fall Prevention Plan has been given, discussed and signed Admission: Completed [facility] Orientation: Patient has been orientated to the room, unit and staff.  Family:present  Orders have been reviewed and implemented. Will continue to monitor the patient. Call light has been placed within reach and bed alarm has been activated.   Elvina Hammers, RN

## 2024-01-22 NOTE — H&P (Cosign Needed Addendum)
 Hospital Admission History and Physical Service Pager: [phone]  Patient name: Stephen Prince Medical record number: [id number] Date of Birth: 1996-01-02 Age: 28 y.o. Gender: male  Primary Care Provider: Roselind Congo, MD Consultants: Nephrology Code Status: Full   Preferred Emergency Contact:  Contact Information     Name Relation Home Work Mobile   Blankenbeckler,TIMOTHY Father   [phone]      Other Contacts     Name Relation Home Work Mobile   Hehn,VICKIE Mother [phone]  [phone]        Chief Complaint: Penile swelling   Assessment and Plan: [name] is a 28 y.o. male presenting with penile edema. Differential for presentation of this includes:  CKD: Edema most likely secondary to low oncotic pressure in setting of CKD 4.  Imaging consistent with edema. Obstruction: Unlikely as patient has been able to successfully void.  No obstruction seen on imaging. Infection: Unlikely given afebrile without leukocytosis. UA inconsistent with acute infection.  Assessment & Plan Penile edema  CKD4 Penile edema and generalized edema likely secondary to low oncotic pressures with CKD 4.  Nephrology following.  Patient has been able to void successfully.  Low concern for obstruction, will assess with bladder scans. S/p CTX in ED, low concern for infectious etiology at this time. - Admit to FMTS with attending Dr Dawn Eth - Appreciate nephrology recommendations - Continue IV Lasix infusion - Continue home calcitriol , ferrous sulfate  - Tylenol  as needed - Bladder scans every 8 - Strict I/O's - Vitals per floor - AM BMP Chronic health problem Seizures: Cont home Lamotrigine  50 daily T1DM: Continue home insulin  pump and CGM. HTN: Continue home amlodipine  10 daily, carvedilol  12.5 twice daily, hydralazine  25 3 times daily HLD: Continue home rosuvastatin  20 daily Mood: Continue home Zoloft  100 daily  FEN/GI: Renal diet VTE Prophylaxis: Heparin   Disposition:  Med-surg  History of Present Illness:  Stephen Prince is a 28 y.o. male presenting with penile edema.    Reports worsening swelling in his groin and penis. It was feeling more difficult to pee though he has continued to be able to void.  No pain with urination, no blood in the urine.  Urine appears frothy, which is baseline for him.  Has successfully voided NAD.  Describes sensation of heaviness of the groin region.  Has also been gaining fluid weight since he was off his lasix from his recent [facility] admission.  Reports swelling of his legs and lower abdomen, as well as feeling more dyspneic.  Face also feels more puffy.  Overall swelling started about 1 wk ago and has been progressing.  No recent fever or new illnesses.  No N/V/D.  No dizziness.  In the ED, patient afebrile without leukocytosis.  Imaging with penile, scrotal, abdominal, pulmonary edema in addition to lower extremity edema.  Nephrology consulted by EDP.  Patient started on IV diuresis.  Per chart review:  - Admitted 4/25 for acute on chronic renal failure, treated w/ IVF, and Dc'd w/ outpt nephro f/u.  - Seen in ED 5/5 for same penile swelling; CT unremarkable and voiding normally, thought to be 2/2 ESRD and fluid overload. Per urology, likely secondary to nephrotic syndrome, no follow up required. Pt was DC'd from ED w/ nephrology f/u.   Review Of Systems: Per HPI   Pertinent Past Medical History: T1DM HTN CKD 4 Seizures Remainder reviewed in history tab.   Pertinent Past Surgical History: Cyst removal on back Kidney biopsy  Remainder reviewed  in history tab.   Pertinent Social History: Tobacco use: none Alcohol use: none Other Substance use: none Lives with dad  Pertinent Family History: Mother: DM, HTN Remainder reviewed in history tab.   Important Outpatient Medications: Amlodipine  10 daily  Carvedilol  12.5 BID Hydralazine  25 TID Rosuvastatin  20 daily  Zoloft  100 daily  Insulin  pump  Lamotrigine  50 daily   Losartan  100 daily Torsemide 40 BID  Lasix 40 daily PRN  Calcitriol  0.25 daily  Ferrous sulfate  325 BID Vit D Zofran  prn   Last took meds yesterday.  Remainder reviewed in medication history.   Objective: BP (!) 160/101   Pulse 86   Temp (!) 97.5 F (36.4 C) (Oral)   Resp 15   Ht 5\' 7"  (1.702 m)   Wt 88.5 kg   SpO2 99%   BMI 30.54 kg/m  Exam: General: No acute distress. Resting comfortably in room. CV: Normal S1/S2. No extra heart sounds. Warm and well-perfused. Pulm: Breathing comfortably on room air. CTAB. No increased WOB. Abd: Soft, mild distention, mild tenderness to palpation throughout.  GU: Deferred given prior exams and imaging.  Skin/Ext:  Warm, dry. 2+ pitting BLE edema to level of knee.  Psych: Pleasant and appropriate.   Labs:  CBC BMET  Recent Labs  Lab 01/22/24 0710  WBC 7.6  HGB 8.0*  HCT 25.4*  PLT 291   Recent Labs  Lab 01/22/24 0710  NA 140  K 3.5  CL 106  CO2 18*  BUN 78*  CREATININE 9.53*  GLUCOSE 129*  CALCIUM  7.9*     BNP    Component Value Date/Time   BNP 928.4 (H) 01/22/2024 0710    Urinalysis    Component Value Date/Time   COLORURINE STRAW (A) 01/22/2024 0726   APPEARANCEUR HAZY (A) 01/22/2024 0726   LABSPEC 1.009 01/22/2024 0726   PHURINE 5.0 01/22/2024 0726   GLUCOSEU 150 (A) 01/22/2024 0726   HGBUR NEGATIVE 01/22/2024 0726   BILIRUBINUR NEGATIVE 01/22/2024 0726   KETONESUR NEGATIVE 01/22/2024 0726   PROTEINUR >=300 (A) 01/22/2024 0726   NITRITE NEGATIVE 01/22/2024 0726   LEUKOCYTESUR NEGATIVE 01/22/2024 0726    EKG: N/a   Imaging Studies Performed:  CXR: Small pleural effusions and mild interstitial edema.  CT A&P:  - Persistent bilateral pleural effusions, R>L with pulmonary edema - Diffuse body wall edema - Diffuse scrotal edema and edema involving the penis - Small volume of free fluid within dependent portion of pelvis. Mild perihepatic ascites overlying anterior dome.  - Non-obstructing R renal  calculi  Carey Chapman, MD PGY-1 [facility]   Albin Huh, MD 01/22/2024, 11:15 AM PGY-2, [facility]  FPTS Intern pager: [phone], text pages welcome Secure chat group [facility] Teaching Service

## 2024-01-23 DIAGNOSIS — I120 Hypertensive chronic kidney disease with stage 5 chronic kidney disease or end stage renal disease: Secondary | ICD-10-CM | POA: Diagnosis present

## 2024-01-23 DIAGNOSIS — N185 Chronic kidney disease, stage 5: Secondary | ICD-10-CM | POA: Diagnosis present

## 2024-01-23 DIAGNOSIS — E8770 Fluid overload, unspecified: Secondary | ICD-10-CM | POA: Diagnosis present

## 2024-01-23 DIAGNOSIS — E785 Hyperlipidemia, unspecified: Secondary | ICD-10-CM | POA: Diagnosis present

## 2024-01-23 DIAGNOSIS — N4889 Other specified disorders of penis: Secondary | ICD-10-CM | POA: Diagnosis present

## 2024-01-23 DIAGNOSIS — Z833 Family history of diabetes mellitus: Secondary | ICD-10-CM | POA: Diagnosis not present

## 2024-01-23 DIAGNOSIS — N179 Acute kidney failure, unspecified: Secondary | ICD-10-CM | POA: Diagnosis present

## 2024-01-23 DIAGNOSIS — J810 Acute pulmonary edema: Secondary | ICD-10-CM | POA: Diagnosis present

## 2024-01-23 DIAGNOSIS — Z9641 Presence of insulin pump (external) (internal): Secondary | ICD-10-CM | POA: Diagnosis present

## 2024-01-23 DIAGNOSIS — E876 Hypokalemia: Secondary | ICD-10-CM | POA: Diagnosis present

## 2024-01-23 DIAGNOSIS — Z79899 Other long term (current) drug therapy: Secondary | ICD-10-CM | POA: Diagnosis not present

## 2024-01-23 DIAGNOSIS — Z6830 Body mass index (BMI) 30.0-30.9, adult: Secondary | ICD-10-CM | POA: Diagnosis not present

## 2024-01-23 DIAGNOSIS — F329 Major depressive disorder, single episode, unspecified: Secondary | ICD-10-CM | POA: Diagnosis present

## 2024-01-23 DIAGNOSIS — E1022 Type 1 diabetes mellitus with diabetic chronic kidney disease: Secondary | ICD-10-CM | POA: Diagnosis present

## 2024-01-23 DIAGNOSIS — Z794 Long term (current) use of insulin: Secondary | ICD-10-CM | POA: Diagnosis not present

## 2024-01-23 DIAGNOSIS — R0601 Orthopnea: Secondary | ICD-10-CM | POA: Diagnosis present

## 2024-01-23 DIAGNOSIS — G40909 Epilepsy, unspecified, not intractable, without status epilepticus: Secondary | ICD-10-CM | POA: Diagnosis present

## 2024-01-23 DIAGNOSIS — N189 Chronic kidney disease, unspecified: Secondary | ICD-10-CM | POA: Diagnosis not present

## 2024-01-23 DIAGNOSIS — D631 Anemia in chronic kidney disease: Secondary | ICD-10-CM | POA: Diagnosis present

## 2024-01-23 DIAGNOSIS — Z8249 Family history of ischemic heart disease and other diseases of the circulatory system: Secondary | ICD-10-CM | POA: Diagnosis not present

## 2024-01-23 DIAGNOSIS — E669 Obesity, unspecified: Secondary | ICD-10-CM | POA: Diagnosis present

## 2024-01-23 LAB — BASIC METABOLIC PANEL WITH GFR
Anion gap: 15 (ref 5–15)
BUN: 74 mg/dL — ABNORMAL HIGH (ref 6–20)
CO2: 17 mmol/L — ABNORMAL LOW (ref 22–32)
Calcium: 7.5 mg/dL — ABNORMAL LOW (ref 8.9–10.3)
Chloride: 106 mmol/L (ref 98–111)
Creatinine, Ser: 9.2 mg/dL — ABNORMAL HIGH (ref 0.61–1.24)
GFR, Estimated: 7 mL/min — ABNORMAL LOW (ref >60)
Glucose, Bld: 115 mg/dL — ABNORMAL HIGH (ref 70–99)
Potassium: 2.9 mmol/L — ABNORMAL LOW (ref 3.5–5.1)
Sodium: 138 mmol/L (ref 135–145)

## 2024-01-23 LAB — URINE CULTURE: Culture: <10000 — AB

## 2024-01-23 LAB — GLUCOSE, CAPILLARY
Glucose-Capillary: 117 mg/dL — ABNORMAL HIGH (ref 70–99)
Glucose-Capillary: 94 mg/dL (ref 70–99)
Glucose-Capillary: 130 mg/dL — ABNORMAL HIGH (ref 70–99)
Glucose-Capillary: 137 mg/dL — ABNORMAL HIGH (ref 70–99)
Glucose-Capillary: 172 mg/dL — ABNORMAL HIGH (ref 70–99)

## 2024-01-23 LAB — MAGNESIUM: Magnesium: 2.2 mg/dL (ref 1.7–2.4)

## 2024-01-23 LAB — PHOSPHORUS: Phosphorus: 9 mg/dL — ABNORMAL HIGH (ref 2.5–4.6)

## 2024-01-23 MED ORDER — POTASSIUM CHLORIDE CRYS ER 20 MEQ PO TBCR
40.0000 meq | EXTENDED_RELEASE_TABLET | Freq: Once | ORAL | Status: AC
  Administered 2024-01-23: 40 meq via ORAL
  Filled 2024-01-23: qty 2

## 2024-01-23 MED ORDER — FUROSEMIDE 10 MG/ML IJ SOLN
120.0000 mg | Freq: Three times a day (TID) | INTRAVENOUS | Status: DC
  Administered 2024-01-23 – 2024-01-25 (×6): 120 mg via INTRAVENOUS
  Filled 2024-01-23 (×4): qty 10
  Filled 2024-01-23: qty 2
  Filled 2024-01-23 (×2): qty 12
  Filled 2024-01-23: qty 10

## 2024-01-23 MED ORDER — LAMOTRIGINE 100 MG PO TABS
50.0000 mg | ORAL_TABLET | Freq: Two times a day (BID) | ORAL | Status: DC
  Administered 2024-01-23 – 2024-01-25 (×4): 50 mg via ORAL
  Filled 2024-01-23 (×4): qty 1

## 2024-01-23 MED ORDER — POTASSIUM CHLORIDE 20 MEQ PO PACK
40.0000 meq | PACK | Freq: Once | ORAL | Status: AC
  Administered 2024-01-23: 40 meq via ORAL
  Filled 2024-01-23: qty 2

## 2024-01-23 NOTE — Plan of Care (Signed)

## 2024-01-23 NOTE — Plan of Care (Signed)
   Problem: Education: Goal: Ability to describe self-care measures that may prevent or decrease complications (Diabetes Survival Skills Education) will improve Outcome: Completed/Met

## 2024-01-23 NOTE — Progress Notes (Addendum)
 [facility] Progress Note  Subjective:  UOP 2500 yest and 1200 cc so far today BP's good Creat 9.2 (down from 9.5) K+ down 2.7  Vitals:   01/22/24 2048 01/23/24 0454 01/23/24 0703 01/23/24 0808  BP: 135/80 139/84 (!) 155/65   Pulse: 85 86 88   Resp: 18 18 18    Temp: 98.1 F (36.7 C) 98.7 F (37.1 C) 98 F (36.7 C)   TempSrc: Oral Oral Oral   SpO2: 92% 92% 98%   Weight:    85.7 kg  Height:        Exam: Gen alert, no distress, on room air Sclera anicteric, throat clear  No jvd or bruits Chest clear bilat to bases RRR no MRG Abd soft ntnd no mass or ascites +bs GU swollen penis and scrotum 2+ MS no joint effusions or deformity Ext diffuse 2+ lower extremity and upper extremity edema Neuro is alert, Ox 3 , nf      Renal-related home meds: Norvasc  10 daily Coreg  12.5 twice daily Lasix 40 mg daily as needed Hydralazine  25 3 times daily Losartan  100 mg daily Torsemide 40 mg twice daily Others: Zoloft , Crestor , Lamictal , insulin , Rocaltrol     Date                             Creat               eGFR (ml/min) Aug 2024                     2.57- 4.11 07/09/23                      3.08 April 25- 28, 2025  7.33 >> 6.62 10- 11 ml/min 01/17/24                        8.00                 9 01/22/24                        9.53                 7    CT abd --> normal appearing kidneys w/o hydronephrosis     Assessment/ Plan: AKI on CKD 5: b/l creat 6.6- 7.3 from April 2025. Creat here is 9.5 in the setting of significant volume overload on exam and mild edema on CXR.  No signs of uremia so no need at this time for dialysis. Pt is on room air. Pt started on high-dose IV lasix. Good UOP w/ 2.5 L yesterday. Creat today is down to 9.2. no uremic findings, still sig edema. Will ^IV lasix to 120 q 8hrs.  Volume overload: diffuse edema, mild pulm edema. Pt was taken off diuretics during recent admission recently and were just recently resumed. Plan is high-dose IV lasix  as above.  Type 1 DM: per pmd     Larry Poag MD  [facility] 01/23/2024, 12:06 PM  Recent Labs  Lab 01/17/24 1718 01/22/24 0710 01/23/24 0450  HGB 8.5* 8.0*  --   ALBUMIN 3.0* 2.9*  --   CALCIUM  7.8* 7.9* 7.5*  PHOS  --   --  9.0*  CREATININE 8.00* 9.53* 9.20*  K 3.5 3.5 2.9*   No results for input(s): "IRON ", "TIBC", "FERRITIN" in the last 168 hours.  Inpatient medications:  amLODipine   10 mg Oral Daily   calcitRIOL   0.25 mcg Oral Daily   carvedilol   12.5 mg Oral BID   ferrous sulfate   325 mg Oral BID WC   heparin   5,000 Units Subcutaneous Q8H   hydrALAZINE   25 mg Oral TID   insulin  pump   Subcutaneous TID WC, HS, 0200   lamoTRIgine   50 mg Oral BID   potassium chloride   40 mEq Oral Once   rosuvastatin   20 mg Oral QHS   sertraline   100 mg Oral Daily    furosemide     [START ON 01/28/2024] iron  sucrose     acetaminophen  **OR** acetaminophen , mouth rinse

## 2024-01-23 NOTE — Progress Notes (Signed)
 Daily Progress Note Intern Pager: [phone]  Patient name: Stephen Prince Medical record number: [id number] Date of birth: 19-Feb-1996 Age: 28 y.o. Gender: male  Primary Care Provider: Roselind Congo, MD Consultants: Nephrology Code Status: Full code  Pt Overview and Major Events to Date:  Stephen Prince is a 28 year old male with past history of CKD 4, type 1 diabetes, epilepsy, hypertension, hyperlipidemia, MDD who was admitted for penile and generalized edema and AKI on CKD.  Patient is being aggressively diuresed and nephrology is following.  Assessment and Plan:  Diuresing appropriately but with minimal change in creatinine.  Side note patient started on on lamotrigine  once daily when appears he takes twice daily per neurology note, patient on interview today, and pharmacy records.  Lamotrigine  is primarily metabolized by glyconeurotic acid conjugation and appears safe to dose and CKD4 at this dosage but will monitor.  Assessment & Plan Penile edema  AKI on CKD4 Volume overload. Ruled down obstruction and infection.  Last 24 I&O -3.5 L, weight -1.5 lb. Creatinine 9.5->9.2.  Nephrology recs still pending today.  - Appreciate nephrology recommendations - Continue IV Lasix 100 once every 8 hours - Monitor and replete K to 4.0 daily - Continue home calcitriol , ferrous sulfate  - Tylenol  as needed - Bladder scans every 8 - Strict I/O's - AM RFP Chronic health problem Seizures: switched lamotrigine  50 mg from once to twice daily per neurology note, dispense records, and per pt. Monitor closely for rash.  T1DM: Continue home insulin  pump and CGM. HTN: Continue home amlodipine  10 daily, carvedilol  12.5 twice daily, hydralazine  25 3 times daily HLD: Continue home rosuvastatin  20 daily Mood: Continue home Zoloft  100 daily    FEN/GI: Renal diet, peripheral IV x 1 PPx: Heparin  prophylactic Dispo:Home pending clinical improvement . Barriers include edema and AKI on CKD.    Subjective:  Patient reports that he is doing well today.  Was asking how long in the hospital and said he would be released for several days given the elevated creatinine.  Discussed that we are going to follow nephrology recommendations but appears that we will continue aggressive diuresis.  Patient reports that the scrotal swelling has improved.  Reports that he does not have any substantial pain and is able to urinate without any issues.  He reports no other concerns.  Discussed if he takes his lamotrigine  once or twice daily and he report he takes it twice daily.  Objective: Temp:  [97.6 F (36.4 C)-98.7 F (37.1 C)] 98 F (36.7 C) (05/11 0703) Pulse Rate:  [82-90] 88 (05/11 0703) Resp:  [14-20] 18 (05/11 0703) BP: (135-168)/(65-101) 155/65 (05/11 0703) SpO2:  [91 %-98 %] 98 % (05/11 0703) Weight:  [85.7 kg-86.4 kg] 85.7 kg (05/11 0160) Physical Exam: General: Calm, cooperative.  Not in acute distress.  Laying in bed.  Father at bedside. Cardiovascular: Normal heart sounds.  Normal rate and rhythm. Respiratory: Crackles in bilateral lung bases and mid thoracic.  Normal effort. Abdomen: Soft and nontender.  No substantial edema. Extremities: Bilateral lower extremity pitting edema to knees.  2+ in feet and lower calf and 1+ in upper calf (baseline per patient).  No edema of the thighs.  Warm and dry extremities. GU: scrotal swelling improving. Continued scrotal and penile edema. Glans well perfused.  No obvious tenderness.    Laboratory: Most recent CBC Lab Results  Component Value Date   WBC 7.6 01/22/2024   HGB 8.0 (L) 01/22/2024   HCT 25.4 (L) 01/22/2024  MCV 87.0 01/22/2024   PLT 291 01/22/2024   Most recent BMP    Latest Ref Rng & Units 01/23/2024    4:50 AM  BMP  Glucose 70 - 99 mg/dL 161   BUN 6 - 20 mg/dL 74   Creatinine 0.96 - 1.24 mg/dL 0.45   Sodium 409 - 811 mmol/L 138   Potassium 3.5 - 5.1 mmol/L 2.9   Chloride 98 - 111 mmol/L 106   CO2 22 - 32 mmol/L  17   Calcium  8.9 - 10.3 mg/dL 7.5     Phosphorus: 9 Magnesium: 2.2   Verdell Given, MD 01/23/2024, 10:58 AM  PGY-1, [facility] FPTS Intern pager: [phone], text pages welcome Secure chat group [facility] Teaching Service

## 2024-01-23 NOTE — Assessment & Plan Note (Addendum)
 Seizures: switched lamotrigine  50 mg from once to twice daily per neurology note, dispense records, and per pt. Monitor closely for rash.  T1DM: Continue home insulin  pump and CGM. HTN: Continue home amlodipine  10 daily, carvedilol  12.5 twice daily, hydralazine  25 3 times daily HLD: Continue home rosuvastatin  20 daily Mood: Continue home Zoloft  100 daily

## 2024-01-23 NOTE — Assessment & Plan Note (Addendum)
 Volume overload. Ruled down obstruction and infection.  Last 24 I&O -3.5 L, weight -1.5 lb. Creatinine 9.5->9.2.  Nephrology recs still pending today.  - Appreciate nephrology recommendations - Continue IV Lasix 100 once every 8 hours - Monitor and replete K to 4.0 daily - Continue home calcitriol , ferrous sulfate  - Tylenol  as needed - Bladder scans every 8 - Strict I/O's - AM RFP

## 2024-01-24 DIAGNOSIS — N189 Chronic kidney disease, unspecified: Secondary | ICD-10-CM | POA: Diagnosis not present

## 2024-01-24 DIAGNOSIS — N179 Acute kidney failure, unspecified: Secondary | ICD-10-CM

## 2024-01-24 LAB — RENAL FUNCTION PANEL
Albumin: 2.5 g/dL — ABNORMAL LOW (ref 3.5–5.0)
Anion gap: 13 (ref 5–15)
BUN: 71 mg/dL — ABNORMAL HIGH (ref 6–20)
CO2: 18 mmol/L — ABNORMAL LOW (ref 22–32)
Calcium: 7.5 mg/dL — ABNORMAL LOW (ref 8.9–10.3)
Chloride: 107 mmol/L (ref 98–111)
Creatinine, Ser: 9.2 mg/dL — ABNORMAL HIGH (ref 0.61–1.24)
GFR, Estimated: 7 mL/min — ABNORMAL LOW (ref >60)
Glucose, Bld: 117 mg/dL — ABNORMAL HIGH (ref 70–99)
Phosphorus: 8.3 mg/dL — ABNORMAL HIGH (ref 2.5–4.6)
Potassium: 3.3 mmol/L — ABNORMAL LOW (ref 3.5–5.1)
Sodium: 138 mmol/L (ref 135–145)

## 2024-01-24 LAB — GLUCOSE, CAPILLARY
Glucose-Capillary: 116 mg/dL — ABNORMAL HIGH (ref 70–99)
Glucose-Capillary: 126 mg/dL — ABNORMAL HIGH (ref 70–99)
Glucose-Capillary: 180 mg/dL — ABNORMAL HIGH (ref 70–99)
Glucose-Capillary: 206 mg/dL — ABNORMAL HIGH (ref 70–99)

## 2024-01-24 MED ORDER — DARBEPOETIN ALFA 60 MCG/0.3ML IJ SOSY
60.0000 ug | PREFILLED_SYRINGE | INTRAMUSCULAR | Status: DC
  Administered 2024-01-24: 60 ug via SUBCUTANEOUS
  Filled 2024-01-24: qty 0.3

## 2024-01-24 MED ORDER — POTASSIUM CHLORIDE 20 MEQ PO PACK
40.0000 meq | PACK | Freq: Three times a day (TID) | ORAL | Status: AC
  Administered 2024-01-24 (×3): 40 meq via ORAL
  Filled 2024-01-24 (×4): qty 2

## 2024-01-24 NOTE — TOC CM/SW Note (Signed)
 Transition of Care [facility]) - Inpatient Brief Assessment   Patient Details  Name: Stephen Prince MRN: [id number] Date of Birth: 1995-10-01  Transition of Care [facility]) CM/SW Contact:    Tom-Johnson, Angelique Ken, RN Phone Number: 01/24/2024, 10:29 AM   Clinical Narrative:  Patient presented to the ED with worsening Groin, Penis and BLE swelling. Admitted with AKI on CKD 4. Urology and Nephrology following. On IV Lasix. From home with his dad, younger sister and brother. Has four supportive siblings. Currently not employed, not in school and not on disability. Does not have DME's at home.  PCP is Roselind Congo, MD and uses [employer].  No TOC needs or recommendations noted at this time.  Patient not Medically ready for discharge.  CM will continue to follow as patient progresses with care towards discharge.                Transition of Care Asessment: Insurance and Status: Insurance coverage has been reviewed Patient has primary care physician: Yes Home environment has been reviewed: Yes Prior level of function:: Independent Prior/Current Home Services: No current home services Social Drivers of Health Review: SDOH reviewed no interventions necessary Readmission risk has been reviewed: Yes Transition of care needs: no transition of care needs at this time

## 2024-01-24 NOTE — Progress Notes (Signed)
 Daily Progress Note Intern Pager: [phone]  Patient name: Stephen Prince Medical record number: [id number] Date of birth: 07/04/1996 Age: 28 y.o. Gender: male  Primary Care Provider: Roselind Congo, MD Consultants: Nephrology Code Status: FULL  Pt Overview and Major Events to Date:  5/10 - Admitted, Nephrology consulted and started diuresis  Assessment and Plan: Stephen Prince is a 28 y.o. male with a pertinent PMH of CKD, T1DM, epilepsy, HTN, HLD, and MDD who presented with generalized edema and was admitted for AKI in setting of volume overload, currently undergoing diuresis per Nephrology. Assessment & Plan Acute kidney injury superimposed on CKD (HCC) Now CKD 5 per Nephrology, but without evidence of uremia. 24 hour UOP of 2.5 L, net negative 5.9 L this admission. Weight to 86 kg from 85.7 kg. K low at 3.3.  Creatinine stable at 9.2. - Strict I&Os - Daily weights - Continue home calcitriol , ferrous sulfate  - AM RFP and Mag - Appreciate nephrology recommendations:  - Lasix infusion increased to 120 mg Q8h  - Monitor and replete K to 4.0 daily Penile edema Likely 2/2 volume overload in setting of possible decreased oncotic pressure.  Ruled out obstruction and infection.  Improving gradually.  No difficulty urinating. - Tylenol  as needed - Discontinued bladder scans - Diuresis as above T1DM (type 1 diabetes mellitus) (HCC) Last A1c 7.4 in August 2024.  CBGs ranging stably in 100s. - Continue home insulin  pump and CGM - CBGs ACHS + 3 am - A1c tomorrow AM Chronic health problem Seizures: Switched lamotrigine  50 mg from daily to BID per Neurology note, dispense records, and per pt.  Monitor closely for rash.  HTN: Continue home amlodipine  10 daily, carvedilol  12.5 BID, hydralazine  25 mg TID HLD: Continue home rosuvastatin  20 daily MDD: Continue home sertraline  100 daily  FEN/GI: Renal diet, PIV in place PPx: Heparin  TID Dispo: Home pending clinical improvement .  Barriers include CKD and Nephrology workup.  Subjective:  NAEON.  This AM, patient reports no difficulty urinating and no abdominal pain.  States that penile edema has been improving, but still noticeable.  Comfortable overall.  Objective: Temp:  [97.7 F (36.5 C)-98.3 F (36.8 C)] 98.3 F (36.8 C) (05/12 0742) Pulse Rate:  [85-88] 88 (05/12 0742) Resp:  [16-18] 18 (05/12 0742) BP: (130-141)/(77-87) 131/80 (05/12 0742) SpO2:  [92 %-98 %] 98 % (05/12 0742) Weight:  [85.7 kg-86 kg] 86 kg (05/12 0500)  Physical Exam: General: Age-appropriate, resting comfortably in bed, drowsy, NAD, alert and at baseline. HEENT: MMM. Cardiovascular: Regular rate and rhythm. Normal S1/S2. No murmurs, rubs, or gallops appreciated. 2+ radial pulses. Pulmonary: Clear bilaterally to auscultation. Normal WOB on room air, no accessory muscle usage. Abdominal: Normoactive bowel sounds, nondistended. No tenderness to deep or light palpation including over suprapubic region. No rebound or guarding. Skin: Warm and dry.  No rashes grossly. Extremities: No peripheral edema bilaterally. Capillary refill <2 seconds.  Laboratory: Most recent CBC Lab Results  Component Value Date   WBC 7.6 01/22/2024   HGB 8.0 (L) 01/22/2024   HCT 25.4 (L) 01/22/2024   MCV 87.0 01/22/2024   PLT 291 01/22/2024   Most recent BMP    Latest Ref Rng & Units 01/24/2024    5:21 AM  BMP  Glucose 70 - 99 mg/dL 086   BUN 6 - 20 mg/dL 71   Creatinine 5.78 - 1.24 mg/dL 4.69   Sodium 629 - 528 mmol/L 138   Potassium 3.5 - 5.1 mmol/L  3.3   Chloride 98 - 111 mmol/L 107   CO2 22 - 32 mmol/L 18   Calcium  8.9 - 10.3 mg/dL 7.5     Other pertinent labs: CBG (last 3)  Recent Labs    01/23/24 1632 01/23/24 2104 01/24/24 0243  GLUCAP 137* 172* 116*    New Imaging/Diagnostic Tests: -None  Stephen Radde, MD 01/24/2024, 7:56 AM  PGY-1, [facility] FPTS Intern pager: [phone], text pages welcome Secure chat  group [facility] Teaching Service

## 2024-01-24 NOTE — Plan of Care (Signed)
  Problem: Education: Goal: Individualized Educational Video(s) Outcome: Completed/Met

## 2024-01-24 NOTE — Assessment & Plan Note (Signed)
 Last A1c 7.4 in August 2024.  CBGs ranging stably in 100s. - Continue home insulin  pump and CGM - CBGs ACHS + 3 am - A1c tomorrow AM

## 2024-01-24 NOTE — Assessment & Plan Note (Addendum)
 Seizures: Switched lamotrigine  50 mg from daily to BID per Neurology note, dispense records, and per pt.  Monitor closely for rash.  HTN: Continue home amlodipine  10 daily, carvedilol  12.5 BID, hydralazine  25 mg TID HLD: Continue home rosuvastatin  20 daily MDD: Continue home sertraline  100 daily

## 2024-01-24 NOTE — Assessment & Plan Note (Addendum)
 Likely 2/2 volume overload in setting of possible decreased oncotic pressure.  Ruled out obstruction and infection.  Improving gradually.  No difficulty urinating. - Tylenol  as needed - Discontinued bladder scans - Diuresis as above

## 2024-01-24 NOTE — Assessment & Plan Note (Addendum)
 Now CKD 5 per Nephrology, but without evidence of uremia. 24 hour UOP of 2.5 L, net negative 5.9 L this admission. Weight to 86 kg from 85.7 kg. K low at 3.3.  Creatinine stable at 9.2. - Strict I&Os - Daily weights - Continue home calcitriol , ferrous sulfate  - AM RFP and Mag - Appreciate nephrology recommendations:  - Lasix infusion increased to 120 mg Q8h  - Monitor and replete K to 4.0 daily

## 2024-01-24 NOTE — Inpatient Diabetes Management (Signed)
 Inpatient Diabetes Program Recommendations  AACE/ADA: New Consensus Statement on Inpatient Glycemic Control (2015)  Target Ranges:  Prepandial:   less than 140 mg/dL      Peak postprandial:   less than 180 mg/dL (1-2 hours)      Critically ill patients:  140 - 180 mg/dL   Lab Results  Component Value Date   GLUCAP 116 (H) 01/24/2024   HGBA1C 7.4 (H) 05/12/2023    Review of Glycemic Control  Latest Reference Range & Units 01/23/24 07:05 01/23/24 11:46 01/23/24 16:32 01/23/24 21:04 01/24/24 02:43 01/24/24 11:48  Glucose-Capillary 70 - 99 mg/dL 94 161 (H) 096 (H) 045 (H) 116 (H) 126 (H)   Diabetes history: DM type 1 Outpatient Diabetes medications: T-Slim  insulin  pump with Novolog  insulin  and Dexcom G6 as CGM Basal settings: 0.45 units hour Total of 10.8 units of basal insulin  in 24 hour period Correction factor 1 units drops glucose 40 points Carbohydrate coverage 1 unit for every 12 grams of carbohydrate Target glucose of 110 Insulin  duration time 5 hours Current orders for Inpatient glycemic control:  Insulin  pump order set ac + hs + 0200am  Insulin  pump insertion site left flank due to be changed either 5/12 or 5/13 CGM location left arm due to be changed in 2 days Extra pump supplies to be brought by sister today  Pt sees Delfin Fell for Diabetes and sees her every 3 months  Thanks,  Eloise Hake RN, MSN, BC-ADM Inpatient Diabetes Coordinator Team Pager [phone] (8a-5p)

## 2024-01-24 NOTE — Plan of Care (Signed)
  Problem: Education: Goal: Individualized Educational Video(s) Outcome: Progressing   Problem: Coping: Goal: Ability to adjust to condition or change in health will improve Outcome: Progressing   Problem: Fluid Volume: Goal: Ability to maintain a balanced intake and output will improve Outcome: Progressing   Problem: Health Behavior/Discharge Planning: Goal: Ability to identify and utilize available resources and services will improve Outcome: Progressing Goal: Ability to manage health-related needs will improve Outcome: Progressing   Problem: Metabolic: Goal: Ability to maintain appropriate glucose levels will improve Outcome: Progressing   Problem: Nutritional: Goal: Maintenance of adequate nutrition will improve Outcome: Progressing Goal: Progress toward achieving an optimal weight will improve Outcome: Progressing   Problem: Skin Integrity: Goal: Risk for impaired skin integrity will decrease Outcome: Progressing   Problem: Tissue Perfusion: Goal: Adequacy of tissue perfusion will improve Outcome: Progressing   Problem: Education: Goal: Knowledge of General Education information will improve Description: Including pain rating scale, medication(s)/side effects and non-pharmacologic comfort measures Outcome: Progressing   Problem: Health Behavior/Discharge Planning: Goal: Ability to manage health-related needs will improve Outcome: Progressing   Problem: Clinical Measurements: Goal: Ability to maintain clinical measurements within normal limits will improve Outcome: Progressing Goal: Will remain free from infection Outcome: Progressing Goal: Diagnostic test results will improve Outcome: Progressing Goal: Respiratory complications will improve Outcome: Progressing Goal: Cardiovascular complication will be avoided Outcome: Progressing   Problem: Activity: Goal: Risk for activity intolerance will decrease Outcome: Progressing   Problem: Nutrition: Goal:  Adequate nutrition will be maintained Outcome: Progressing   Problem: Coping: Goal: Level of anxiety will decrease Outcome: Progressing   Problem: Elimination: Goal: Will not experience complications related to bowel motility Outcome: Progressing Goal: Will not experience complications related to urinary retention Outcome: Progressing   Problem: Pain Managment: Goal: General experience of comfort will improve and/or be controlled Outcome: Progressing   Problem: Safety: Goal: Ability to remain free from injury will improve Outcome: Progressing   Problem: Skin Integrity: Goal: Risk for impaired skin integrity will decrease Outcome: Progressing   Problem: Education: Goal: Knowledge of disease and its progression will improve Outcome: Progressing   Problem: Health Behavior/Discharge Planning: Goal: Ability to manage health-related needs will improve Outcome: Progressing   Problem: Clinical Measurements: Goal: Complications related to the disease process or treatment will be avoided or minimized Outcome: Progressing Goal: Dialysis access will remain free of complications Outcome: Progressing   Problem: Activity: Goal: Activity intolerance will improve Outcome: Progressing   Problem: Fluid Volume: Goal: Fluid volume balance will be maintained or improved Outcome: Progressing   Problem: Nutritional: Goal: Ability to make appropriate dietary choices will improve Outcome: Progressing   Problem: Respiratory: Goal: Respiratory symptoms related to disease process will be avoided Outcome: Progressing   Problem: Self-Concept: Goal: Body image disturbance will be avoided or minimized Outcome: Progressing   Problem: Urinary Elimination: Goal: Progression of disease will be identified and treated Outcome: Progressing

## 2024-01-24 NOTE — Progress Notes (Signed)
 [facility] NEPHROLOGY PROGRESS NOTE  Assessment/ Plan: Pt is a 28 y.o. yo male with type 1 diabetes, HLD, HTN, CKD 5 presented with fluid overload.  Follows with Dr. Zelda Hickman at [facility].  # Acute kidney injury on CKD stage V b/l cr around 6-7 and GFR <10.  Admitted with significant fluid overload.  Responding with IV diuretics without overt signs or symptoms of uremia.  Plan to continue current high dose of diuretics and monitor renal function.  He is already following at [facility] for kidney transplant evaluation.  Patient understands that he might need to start dialysis if renal function does not improve significantly. Strict ins and outs and daily lab.  # Hypertension/volume overload: Diffuse edema now managing with diuretics as above.  # Hypokalemia: Replete potassium chloride  magnesium level acceptable.  #Anemia: Receiving iron , order a dose of Aranesp.  Subjective: Seen and examined.  Reports little tired after poor night sleep with many interruptions in the hospital.  He denies nausea, vomiting, dysgeusia, chest pain or shortness of breath.  Urine output 2.5 L. Objective Vital signs in last 24 hours: Vitals:   01/23/24 1924 01/24/24 0310 01/24/24 0500 01/24/24 0742  BP: (!) 141/84 136/87  131/80  Pulse: 87 85  88  Resp: 17 16  18   Temp: 97.9 F (36.6 C) 98.3 F (36.8 C)  98.3 F (36.8 C)  TempSrc: Oral Oral  Oral  SpO2: 92% 94%  98%  Weight:   86 kg   Height:       Weight change: -2.722 kg  Intake/Output Summary (Last 24 hours) at 01/24/2024 1049 Last data filed at 01/24/2024 0745 Gross per 24 hour  Intake 147.08 ml  Output 2775 ml  Net -2627.92 ml       Labs: RENAL PANEL Recent Labs  Lab 01/17/24 1718 01/22/24 0710 01/23/24 0450 01/24/24 0521  NA 137 140 138 138  K 3.5 3.5 2.9* 3.3*  CL 109 106 106 107  CO2 14* 18* 17* 18*  GLUCOSE 134* 129* 115* 117*  BUN 61* 78* 74* 71*  CREATININE 8.00* 9.53* 9.20* 9.20*  CALCIUM  7.8* 7.9* 7.5*  7.5*  MG  --   --  2.2  --   PHOS  --   --  9.0* 8.3*  ALBUMIN 3.0* 2.9*  --  2.5*    Liver Function Tests: Recent Labs  Lab 01/17/24 1718 01/22/24 0710 01/24/24 0521  AST 15 17  --   ALT 24 19  --   ALKPHOS 76 64  --   BILITOT 0.5 0.3  --   PROT 6.2* 5.8*  --   ALBUMIN 3.0* 2.9* 2.5*   No results for input(s): "LIPASE", "AMYLASE" in the last 168 hours. No results for input(s): "AMMONIA" in the last 168 hours. CBC: Recent Labs    01/08/24 0632 01/09/24 0822 01/10/24 0516 01/17/24 1718 01/22/24 0710  HGB 8.8* 8.4* 8.6* 8.5* 8.0*  MCV 87.6 88.6 87.2 86.2 87.0    Cardiac Enzymes: No results for input(s): "CKTOTAL", "CKMB", "CKMBINDEX", "TROPONINI" in the last 168 hours. CBG: Recent Labs  Lab 01/23/24 0705 01/23/24 1146 01/23/24 1632 01/23/24 2104 01/24/24 0243  GLUCAP 94 130* 137* 172* 116*    Iron  Studies: No results for input(s): "IRON ", "TIBC", "TRANSFERRIN", "FERRITIN" in the last 72 hours. Studies/Results: No results found.  Medications: Infusions:  furosemide 120 mg (01/24/24 0537)   [START ON 01/28/2024] iron  sucrose      Scheduled Medications:  amLODipine   10 mg Oral Daily  calcitRIOL   0.25 mcg Oral Daily   carvedilol   12.5 mg Oral BID   ferrous sulfate   325 mg Oral BID WC   heparin   5,000 Units Subcutaneous Q8H   hydrALAZINE   25 mg Oral TID   insulin  pump   Subcutaneous TID WC, HS, 0200   lamoTRIgine   50 mg Oral BID   rosuvastatin   20 mg Oral QHS   sertraline   100 mg Oral Daily    have reviewed scheduled and prn medications.  Physical Exam: General:NAD, comfortable Heart:RRR, s1s2 nl Lungs:clear b/l, no crackle Abdomen:soft, Non-tender, non-distended Extremities: Bilateral lower extremities pitting edema+ Dialysis Access: None  Alecxis Baltzell Prasad Masaki Rothbauer 01/24/2024,10:49 AM  LOS: 1 day

## 2024-01-25 ENCOUNTER — Other Ambulatory Visit (HOSPITAL_COMMUNITY): Payer: Self-pay

## 2024-01-25 DIAGNOSIS — N179 Acute kidney failure, unspecified: Secondary | ICD-10-CM | POA: Diagnosis not present

## 2024-01-25 DIAGNOSIS — N189 Chronic kidney disease, unspecified: Secondary | ICD-10-CM | POA: Diagnosis not present

## 2024-01-25 LAB — MAGNESIUM: Magnesium: 2.2 mg/dL (ref 1.7–2.4)

## 2024-01-25 LAB — RENAL FUNCTION PANEL
Albumin: 2.5 g/dL — ABNORMAL LOW (ref 3.5–5.0)
Anion gap: 13 (ref 5–15)
BUN: 71 mg/dL — ABNORMAL HIGH (ref 6–20)
CO2: 17 mmol/L — ABNORMAL LOW (ref 22–32)
Calcium: 7.9 mg/dL — ABNORMAL LOW (ref 8.9–10.3)
Chloride: 107 mmol/L (ref 98–111)
Creatinine, Ser: 9.3 mg/dL — ABNORMAL HIGH (ref 0.61–1.24)
GFR, Estimated: 7 mL/min — ABNORMAL LOW (ref >60)
Glucose, Bld: 130 mg/dL — ABNORMAL HIGH (ref 70–99)
Phosphorus: 8.2 mg/dL — ABNORMAL HIGH (ref 2.5–4.6)
Potassium: 3.3 mmol/L — ABNORMAL LOW (ref 3.5–5.1)
Sodium: 137 mmol/L (ref 135–145)

## 2024-01-25 LAB — HEMOGLOBIN A1C
Hgb A1c MFr Bld: 6.5 % — ABNORMAL HIGH (ref 4.8–5.6)
Mean Plasma Glucose: 140 mg/dL

## 2024-01-25 LAB — GLUCOSE, CAPILLARY
Glucose-Capillary: 162 mg/dL — ABNORMAL HIGH (ref 70–99)
Glucose-Capillary: 146 mg/dL — ABNORMAL HIGH (ref 70–99)
Glucose-Capillary: 130 mg/dL — ABNORMAL HIGH (ref 70–99)

## 2024-01-25 MED ORDER — POTASSIUM CHLORIDE 20 MEQ PO PACK
20.0000 meq | PACK | Freq: Every day | ORAL | Status: DC
  Administered 2024-01-25: 20 meq via ORAL
  Filled 2024-01-25: qty 1

## 2024-01-25 MED ORDER — TORSEMIDE 20 MG PO TABS
40.0000 mg | ORAL_TABLET | Freq: Two times a day (BID) | ORAL | 0 refills | Status: DC
  Filled 2024-01-25: qty 120, 30d supply, fill #0

## 2024-01-25 MED ORDER — ACETAMINOPHEN 325 MG PO TABS: 650.0000 mg | ORAL_TABLET | Freq: Four times a day (QID) | ORAL | Status: AC | PRN

## 2024-01-25 MED ORDER — TORSEMIDE 20 MG PO TABS: 40.0000 mg | ORAL_TABLET | Freq: Two times a day (BID) | ORAL | Status: DC

## 2024-01-25 MED ORDER — LAMOTRIGINE 25 MG PO TABS
50.0000 mg | ORAL_TABLET | Freq: Two times a day (BID) | ORAL | 0 refills | Status: DC
  Filled 2024-01-25: qty 120, 30d supply, fill #0

## 2024-01-25 MED ORDER — POTASSIUM CHLORIDE 20 MEQ PO PACK
40.0000 meq | PACK | Freq: Every day | ORAL | 0 refills | Status: AC
  Filled 2024-01-25: qty 60, 30d supply, fill #0

## 2024-01-25 NOTE — Discharge Summary (Addendum)
 Family Medicine Teaching [facility] Discharge Summary  Patient name: Stephen Prince Medical record number: [id number] Date of birth: 05/25/1996 Age: 28 y.o. Gender: male Date of Admission: 01/22/2024  Date of Discharge: 01/25/2024 Admitting Physician: Candee Cha, MD  Primary Care Provider: Roselind Congo, MD Consultants: Nephrology  Indication for Hospitalization: AKI, hypervolemia  Discharge Diagnoses/Problem List:  Principal Problem for Admission: AKI on CKD Other Problems addressed during stay:  Principal Problem:   Acute kidney injury superimposed on CKD [facility]) Active Problems:   Penile edema   Chronic health problem   T1DM (type 1 diabetes mellitus) [facility])   Brief Hospital Course:  CRYSTIAN Prince is a 28 y.o. year old with a history of CKD, T1DM, epilepsy, HTN, HLD, MDD who presented with generalized edema and was admitted to the [facility] Medicine Teaching service for AKI in the setting of volume overload.  AKI on CKD 5  Penile edema Patient presented with generalized edema worst in the penile region suspected 2/2 low oncotic pressure.  Creatinine with persistent uptrend throughout 2024, with baseline 6-7, but elevated to 9.53 on admission.  Nephrology consulted on admission and patient was started on 100 mg Lasix IV Q8h, which was subsequently increased to 120 mg the following day.  Creatinine remained at 9.3 at time of discharge, but patient had no evidence of uremia and had good UOP with 8.1 L net negative output for the admission.  Penile edema improved with diuresis and patient discharged with torsemide 40 mg BID at baseline volume status, dry weight of 83.2 kg.  Nephrology also recommended 40 mEq KCl daily at discharge.  Epilepsy Switched lamotrigine  50 mg from daily to BID on admission per Neurology note, dispense records, and patient reports.  Monitored closely for rash, discharged on new regimen.  Other chronic conditions were medically managed with home  medications and formulary alternatives as necessary (HTN, HLD, MDD, T1DM).  PCP Follow-up Recommendations: Recommend repeat BMP in 1 week to evaluate creatinine Monitor volume status and weight on torsemide, evaluate for signs of uremia or decreasing UOP, which would be indications for HD Follow up with [facility] - plan appears to be to start home peritoneal dialysis   Disposition: Home  Discharge Condition: Stable, baseline volume status  Discharge Exam:  Vitals:   01/25/24 0417 01/25/24 0803  BP: (!) 140/85 (!) 151/98  Pulse: 84 85  Resp: 20 19  Temp: 97.6 F (36.4 C) 98.3 F (36.8 C)  SpO2: 95% 94%   Physical Exam: General: Age-appropriate, resting comfortably in bed, NAD, alert and at baseline. HEENT: No scleral icterus.  MMM. Cardiovascular: Regular rate and rhythm. Normal S1/S2. No murmurs, rubs, or gallops appreciated. Pulmonary: Clear bilaterally to auscultation; no wheezes, crackles, or rhonchi. Normal WOB on room air. Abdominal: Normoactive bowel sounds, nondistended. No tenderness to deep or light palpation. No rebound or guarding. GU: Deferred per discussion with patient. Skin: Warm and dry. Extremities: 2+ peripheral pitting edema bilaterally.  Capillary refill <2 seconds.  Significant Procedures: None  Significant Labs and Imaging:  No results for input(s): "WBC", "HGB", "HCT", "PLT" in the last 48 hours. Recent Labs  Lab 01/24/24 0521 01/25/24 0522  NA 138 137  K 3.3* 3.3*  CL 107 107  CO2 18* 17*  GLUCOSE 117* 130*  BUN 71* 71*  CREATININE 9.20* 9.30*  CALCIUM  7.5* 7.9*  MG  --  2.2  PHOS 8.3* 8.2*  ALBUMIN 2.5* 2.5*    -CXR: Small pleural effusions and mild interstitial  edema.   CT A&P:  - Persistent bilateral pleural effusions, R>L with pulmonary edema - Diffuse body wall edema - Diffuse scrotal edema and edema involving the penis - Small volume of free fluid within dependent portion of pelvis. Mild perihepatic ascites overlying  anterior dome.  - Non-obstructing R renal calculi  Results/Tests Pending at Time of Discharge: Unresulted Labs (From admission, onward)     Start     Ordered   01/26/24 0500  Renal function panel  Tomorrow morning,   R       Question:  Specimen collection method  Answer:  Lab=Lab collect   01/25/24 1057   01/26/24 0500  Magnesium  Tomorrow morning,   R       Question:  Specimen collection method  Answer:  Lab=Lab collect   01/25/24 1057   01/25/24 0500  Hemoglobin A1c  Tomorrow morning,   R       Question:  Specimen collection method  Answer:  Lab=Lab collect   01/24/24 0809            Discharge Medications:  Allergies as of 01/25/2024   No Known Allergies      Medication List     STOP taking these medications    furosemide 40 MG tablet Commonly known as: LASIX   losartan  100 MG tablet Commonly known as: COZAAR        TAKE these medications    acetaminophen  325 MG tablet Commonly known as: TYLENOL  Take 2 tablets (650 mg total) by mouth every 6 (six) hours as needed for mild pain (pain score 1-3) (or Fever >/= 101).   amLODipine  10 MG tablet Commonly known as: NORVASC  Take 1 tablet (10 mg total) by mouth daily.   calcitRIOL  0.25 MCG capsule Commonly known as: ROCALTROL  Take 0.25 mcg by mouth daily.   carvedilol  12.5 MG tablet Commonly known as: COREG  Take 1 tablet (12.5 mg total) by mouth 2 (two) times daily.   Dexcom G7 Sensor Misc Inject 1 application  into the skin See admin instructions. Every 10 days   ferrous sulfate  325 (65 FE) MG EC tablet Take 1 tablet (325 mg total) by mouth 2 (two) times daily.   Gvoke HypoPen 2-Pack 1 MG/0.2ML Soaj Generic drug: Glucagon Inject 1 pen  into the skin once as needed (For severe hypoglycemia).   hydrALAZINE  25 MG tablet Commonly known as: APRESOLINE  Take 1 tablet (25 mg total) by mouth 3 (three) times daily.   insulin  aspart 100 UNIT/ML injection Commonly known as: novoLOG  Inject 60 Units into the skin  daily. Insulin  pump   lamoTRIgine  25 MG tablet Commonly known as: LAMICTAL  Take 2 tablets (50 mg total) by mouth 2 (two) times daily. What changed:  how much to take how to take this when to take this additional instructions   ondansetron  4 MG tablet Commonly known as: ZOFRAN  Take 1 tablet (4 mg total) by mouth every 6 (six) hours. What changed:  when to take this reasons to take this   potassium chloride  20 MEQ packet Commonly known as: KLOR-CON  Dissolve 2 PACKETS (40 mEq total) in 4 ounces of water and drink once daily.   rosuvastatin  20 MG tablet Commonly known as: CRESTOR  Take 20 mg by mouth at bedtime.   senna-docusate 8.6-50 MG tablet Commonly known as: Senokot-S Take 1 tablet by mouth at bedtime as needed for moderate constipation.   sertraline  100 MG tablet Commonly known as: ZOLOFT  Take 100 mg by mouth daily.   T:slim X2 Ins  Pump/Control-IQ Devi 1 Device by Does not apply route every 3 (three) days. Change site every 3 days   torsemide 20 MG tablet Commonly known as: DEMADEX Take 2 tablets (40 mg total) by mouth 2 (two) times daily. What changed: when to take this   Vitamin D (Ergocalciferol) 1.25 MG (50000 UNIT) Caps capsule Commonly known as: DRISDOL Take 50,000 Units by mouth once a week. Tuesday        Discharge Instructions: Please refer to Patient Instructions section of EMR for full details.  Patient was counseled important signs and symptoms that should prompt return to medical care, changes in medications, dietary instructions, activity restrictions, and follow up appointments.   Follow-Up Appointments: Future Appointments  Date Time Provider Department Center  01/28/2024  8:00 AM [id number] None  02/11/2024 10:15 AM Reina Cara, DPM [facility]  04/10/2024  8:15 AM Dellar Fenton, DO [facility] None  04/10/2024  1:00 PM Jhonny Moss, MD [facility] None  Instructed to call PCP and schedule 1 week follow up.  Jabre Heo,  Hema Lanza, MD 01/25/2024, 11:53 AM PGY-1, [facility]

## 2024-01-25 NOTE — Assessment & Plan Note (Addendum)
 Likely 2/2 volume overload in setting of possible decreased oncotic pressure.  Ruled out obstruction and infection.  Improving gradually.  No difficulty urinating. - Tylenol  as needed - Diuresis as above

## 2024-01-25 NOTE — Assessment & Plan Note (Addendum)
 Last A1c 7.4 in August 2024.  CBGs ranging stably in 100s. - Continue home insulin  pump and CGM - CBGs ACHS + 3 am - Follow up A1c

## 2024-01-25 NOTE — Assessment & Plan Note (Signed)
 Seizures: Switched lamotrigine  50 mg from daily to BID per Neurology note, dispense records, and per pt.  Monitor closely for rash.  HTN: Continue home amlodipine  10 daily, carvedilol  12.5 BID, hydralazine  25 mg TID HLD: Continue home rosuvastatin  20 daily MDD: Continue home sertraline  100 daily

## 2024-01-25 NOTE — TOC Transition Note (Signed)
 Transition of Care [facility]) - Discharge Note   Patient Details  Name: Stephen Prince MRN: [id number] Date of Birth: May 05, 1996  Transition of Care [facility]) CM/SW Contact:  Tom-Johnson, Angelique Ken, RN Phone Number: 01/25/2024, 12:47 PM   Clinical Narrative:     Patient is scheduled for discharge today.  Readmission Risk Assessment done. Outpatient f/u, hospital f/u and discharge instructions on AVS. Prescriptions sent to [facility] pharmacy and patient will receive meds prior discharge. No TOC needs or recommendations noted. Father, Emeterio Hansen to transport at discharge.  No further TOC needs noted.       Final next level of care: Home/Self Care Barriers to Discharge: Barriers Resolved   Patient Goals and CMS Choice Patient states their goals for this hospitalization and ongoing recovery are:: To return home CMS Medicare.gov Compare Post Acute Care list provided to:: Patient Choice offered to / list presented to : NA      Discharge Placement                Patient to be transferred to facility by: Father Name of family member notified: TImothy    Discharge Plan and Services Additional resources added to the After Visit Summary for                  DME Arranged: N/A DME Agency: NA       HH Arranged: NA HH Agency: NA        Social Drivers of Health (SDOH) Interventions SDOH Screenings   Food Insecurity: No Food Insecurity (01/22/2024)  Housing: Low Risk  (01/22/2024)  Transportation Needs: No Transportation Needs (01/22/2024)  Utilities: Not At Risk (01/22/2024)  Tobacco Use: Low Risk  (01/22/2024)     Readmission Risk Interventions    01/24/2024   10:29 AM  Readmission Risk Prevention Plan  Transportation Screening Complete  PCP or Specialist Appt within 5-7 Days Complete  Home Care Screening Complete  Medication Review (RN CM) Referral to Pharmacy

## 2024-01-25 NOTE — Assessment & Plan Note (Addendum)
 CKD stage V 2/2 T1DM on IV diuresis per Nephrology but without evidence of uremia and with good UOP.  The patient is following at [facility] for kidney transplant evaluation. 24 hour UOP of 2.5 L, net negative 8.1 L this admission. Weight to 83.2 kg from 86 kg. K low at 3.3.  Creatinine stable at 9.3. - Strict I&Os - Daily weights - Continue home calcitriol , ferrous sulfate  - AM RFP and Mag - Appreciate nephrology recommendations:  - Lasix infusion increased to 120 mg Q8h  - Monitor and replete K to 4.0 daily

## 2024-01-25 NOTE — Plan of Care (Signed)
  Problem: Coping: Goal: Ability to adjust to condition or change in health will improve Outcome: Adequate for Discharge   Problem: Fluid Volume: Goal: Ability to maintain a balanced intake and output will improve Outcome: Adequate for Discharge   Problem: Health Behavior/Discharge Planning: Goal: Ability to identify and utilize available resources and services will improve Outcome: Adequate for Discharge Goal: Ability to manage health-related needs will improve Outcome: Adequate for Discharge   Problem: Metabolic: Goal: Ability to maintain appropriate glucose levels will improve Outcome: Adequate for Discharge   Problem: Nutritional: Goal: Maintenance of adequate nutrition will improve Outcome: Adequate for Discharge Goal: Progress toward achieving an optimal weight will improve Outcome: Adequate for Discharge   Problem: Skin Integrity: Goal: Risk for impaired skin integrity will decrease Outcome: Adequate for Discharge   Problem: Tissue Perfusion: Goal: Adequacy of tissue perfusion will improve Outcome: Adequate for Discharge   Problem: Education: Goal: Knowledge of General Education information will improve Description: Including pain rating scale, medication(s)/side effects and non-pharmacologic comfort measures Outcome: Adequate for Discharge   Problem: Health Behavior/Discharge Planning: Goal: Ability to manage health-related needs will improve Outcome: Adequate for Discharge   Problem: Clinical Measurements: Goal: Ability to maintain clinical measurements within normal limits will improve Outcome: Adequate for Discharge Goal: Will remain free from infection Outcome: Adequate for Discharge Goal: Diagnostic test results will improve Outcome: Adequate for Discharge Goal: Respiratory complications will improve Outcome: Adequate for Discharge Goal: Cardiovascular complication will be avoided Outcome: Adequate for Discharge   Problem: Activity: Goal: Risk for  activity intolerance will decrease Outcome: Adequate for Discharge   Problem: Nutrition: Goal: Adequate nutrition will be maintained Outcome: Adequate for Discharge   Problem: Coping: Goal: Level of anxiety will decrease Outcome: Adequate for Discharge   Problem: Elimination: Goal: Will not experience complications related to bowel motility Outcome: Adequate for Discharge Goal: Will not experience complications related to urinary retention Outcome: Adequate for Discharge   Problem: Pain Managment: Goal: General experience of comfort will improve and/or be controlled Outcome: Adequate for Discharge   Problem: Safety: Goal: Ability to remain free from injury will improve Outcome: Adequate for Discharge   Problem: Skin Integrity: Goal: Risk for impaired skin integrity will decrease Outcome: Adequate for Discharge   Problem: Education: Goal: Knowledge of disease and its progression will improve Outcome: Adequate for Discharge   Problem: Health Behavior/Discharge Planning: Goal: Ability to manage health-related needs will improve Outcome: Adequate for Discharge   Problem: Clinical Measurements: Goal: Complications related to the disease process or treatment will be avoided or minimized Outcome: Adequate for Discharge Goal: Dialysis access will remain free of complications Outcome: Adequate for Discharge   Problem: Activity: Goal: Activity intolerance will improve Outcome: Adequate for Discharge   Problem: Fluid Volume: Goal: Fluid volume balance will be maintained or improved Outcome: Adequate for Discharge   Problem: Nutritional: Goal: Ability to make appropriate dietary choices will improve Outcome: Adequate for Discharge   Problem: Respiratory: Goal: Respiratory symptoms related to disease process will be avoided Outcome: Adequate for Discharge   Problem: Self-Concept: Goal: Body image disturbance will be avoided or minimized Outcome: Adequate for  Discharge   Problem: Urinary Elimination: Goal: Progression of disease will be identified and treated Outcome: Adequate for Discharge

## 2024-01-25 NOTE — Progress Notes (Signed)
 [facility] NEPHROLOGY PROGRESS NOTE  Assessment/ Plan: Pt is a 28 y.o. yo male with type 1 diabetes, HLD, HTN, CKD 5 presented with fluid overload.  Follows with Dr. Zelda Hickman at [facility]  # Acute kidney injury on CKD stage V b/l cr around 6-7 and GFR <10.  Admitted with significant fluid overload.  Responding with IV diuretics without overt signs or symptoms of uremia. The patient has plan to start PD as outpatient therefore not wanting to start dialysis in the hospital.  I agree with his especially since he is clinically much improved and fortunately there is no urgent indication to start dialysis at this time.  I spoke with his nephrologist Dr. Zelda Hickman who has referred the patient to do PD as outpatient.  I will resume diuretics to torsemide 40 mg twice a day.  The patient has appointment at [facility] next week. He is already following at [facility] for kidney transplant evaluation.  Ok to discharge from renal perspective.  # Hypertension/volume overload: Diffuse edema now managing with diuretics as above.  # Hypokalemia: Replete potassium chloride  magnesium level acceptable. Discharge home with KCl 40 mg daily.  #Anemia: Received Aranesp on 5/12 and receiving IV iron .  Sign off, please call us  back with question.  Subjective: Seen and examined.  He feels good today.  Denies nausea, vomiting, dysgeusia, chest pain, shortness of breath or fatigue.  He wants to go home.  His sister was presented with him. Objective Vital signs in last 24 hours: Vitals:   01/24/24 1957 01/25/24 0417 01/25/24 0803 01/25/24 1024  BP: (!) 141/91 (!) 140/85 (!) 151/98   Pulse: 79 84 85   Resp: 20 20 19    Temp: 97.7 F (36.5 C) 97.6 F (36.4 C) 98.3 F (36.8 C)   TempSrc: Oral Oral Oral   SpO2: 95% 95% 94%   Weight:    83.2 kg  Height:       Weight change:   Intake/Output Summary (Last 24 hours) at 01/25/2024 1105 Last data filed at 01/25/2024 0600 Gross per 24 hour  Intake  331.91 ml  Output 1750 ml  Net -1418.09 ml       Labs: RENAL PANEL Recent Labs  Lab 01/22/24 0710 01/23/24 0450 01/24/24 0521 01/25/24 0522  NA 140 138 138 137  K 3.5 2.9* 3.3* 3.3*  CL 106 106 107 107  CO2 18* 17* 18* 17*  GLUCOSE 129* 115* 117* 130*  BUN 78* 74* 71* 71*  CREATININE 9.53* 9.20* 9.20* 9.30*  CALCIUM  7.9* 7.5* 7.5* 7.9*  MG  --  2.2  --  2.2  PHOS  --  9.0* 8.3* 8.2*  ALBUMIN 2.9*  --  2.5* 2.5*    Liver Function Tests: Recent Labs  Lab 01/22/24 0710 01/24/24 0521 01/25/24 0522  AST 17  --   --   ALT 19  --   --   ALKPHOS 64  --   --   BILITOT 0.3  --   --   PROT 5.8*  --   --   ALBUMIN 2.9* 2.5* 2.5*   No results for input(s): "LIPASE", "AMYLASE" in the last 168 hours. No results for input(s): "AMMONIA" in the last 168 hours. CBC: Recent Labs    01/08/24 0632 01/09/24 0822 01/10/24 0516 01/17/24 1718 01/22/24 0710  HGB 8.8* 8.4* 8.6* 8.5* 8.0*  MCV 87.6 88.6 87.2 86.2 87.0    Cardiac Enzymes: No results for input(s): "CKTOTAL", "CKMB", "CKMBINDEX", "TROPONINI" in the last 168  hours. CBG: Recent Labs  Lab 01/24/24 1148 01/24/24 1633 01/24/24 1959 01/25/24 0224 01/25/24 0801  GLUCAP 126* 180* 206* 162* 146*    Iron  Studies: No results for input(s): "IRON ", "TIBC", "TRANSFERRIN", "FERRITIN" in the last 72 hours. Studies/Results: No results found.  Medications: Infusions:  furosemide 120 mg (01/25/24 0510)   [START ON 01/28/2024] iron  sucrose      Scheduled Medications:  amLODipine   10 mg Oral Daily   calcitRIOL   0.25 mcg Oral Daily   carvedilol   12.5 mg Oral BID   darbepoetin (ARANESP) injection - NON-DIALYSIS  60 mcg Subcutaneous Q Mon-1800   ferrous sulfate   325 mg Oral BID WC   heparin   5,000 Units Subcutaneous Q8H   hydrALAZINE   25 mg Oral TID   insulin  pump   Subcutaneous TID WC, HS, 0200   lamoTRIgine   50 mg Oral BID   rosuvastatin   20 mg Oral QHS   sertraline   100 mg Oral Daily    have reviewed scheduled  and prn medications.  Physical Exam: General:NAD, comfortable Heart:RRR, s1s2 nl Lungs:clear b/l, no crackle Abdomen:soft, Non-tender, non-distended Extremities: Bilateral lower extremities pitting edema improving Dialysis Access: None  Shannin Naab Lansing Planas 01/25/2024,11:05 AM  LOS: 2 days

## 2024-01-25 NOTE — Discharge Instructions (Signed)
 Dear Stephen Prince,   Thank you so much for allowing us  to be part of your care!  You were admitted to [facility] for volume overload, kidney injury, and swelling.  We treated you with IV Lasix and are discharging you on a diuretic regimen with torsemide.   POST-HOSPITAL & CARE INSTRUCTIONS Please call your PCP and schedule an appointment within 1 week. Please let PCP/Specialists know of any changes that were made.  Please see medications section of this packet for any medication changes.   DOCTOR'S APPOINTMENT & FOLLOW UP CARE INSTRUCTIONS  Future Appointments  Date Time Provider Department Center  01/28/2024  8:00 AM [id number] None  02/11/2024 10:15 AM Reina Cara, DPM [facility]  04/10/2024  8:15 AM Dellar Fenton, DO [facility] None  04/10/2024  1:00 PM Jhonny Moss, MD [facility] None     RETURN PRECAUTIONS: Please return if you notice worsening swelling, increasing weight gain day to day, or shortness of breath Please have those around you monitor you closely for signs of confusion, which would also be a reason to get seen by a medical provider  Take care and be well!  Family Medicine Teaching Service Inpatient Team [facility]  [address] [place], [place] [address] [phone]

## 2024-01-25 NOTE — Progress Notes (Signed)
 Daily Progress Note Intern Pager: [phone]  Patient name: Stephen Prince Medical record number: [id number] Date of birth: April 25, 1996 Age: 28 y.o. Gender: male  Primary Care Provider: Roselind Congo, MD Consultants: Nephrology Code Status: FULL  Pt Overview and Major Events to Date:  5/10 - Admitted, Nephrology consulted and started diuresis with IV Lasix   Assessment and Plan: Stephen Prince is a 28 y.o. male with a pertinent PMH of CKD, T1DM, HTN, and epilepsy who presented with generalized edema greatest in genitals and was admitted for AKI in setting of significant volume overload, currently undergoing IV Lasix diuresis per Nephrology until euvolemic and then will discharge with outpatient diuretic regimen. Assessment & Plan Acute kidney injury superimposed on CKD (HCC) CKD stage V 2/2 T1DM on IV diuresis per Nephrology but without evidence of uremia and with good UOP.  The patient is following at [facility] for kidney transplant evaluation. 24 hour UOP of 2.5 L, net negative 8.1 L this admission. Weight to 83.2 kg from 86 kg. K low at 3.3.  Creatinine stable at 9.3. - Strict I&Os - Daily weights - Continue home calcitriol , ferrous sulfate  - AM RFP and Mag - Appreciate nephrology recommendations:  - Lasix infusion increased to 120 mg Q8h  - Monitor and replete K to 4.0 daily Penile edema Likely 2/2 volume overload in setting of possible decreased oncotic pressure.  Ruled out obstruction and infection.  Improving gradually.  No difficulty urinating. - Tylenol  as needed - Diuresis as above T1DM (type 1 diabetes mellitus) (HCC) Last A1c 7.4 in August 2024.  CBGs ranging stably in 100s. - Continue home insulin  pump and CGM - CBGs ACHS + 3 am - Follow up A1c Chronic health problem Seizures: Switched lamotrigine  50 mg from daily to BID per Neurology note, dispense records, and per pt.  Monitor closely for rash.  HTN: Continue home amlodipine  10 daily, carvedilol  12.5 BID,  hydralazine  25 mg TID HLD: Continue home rosuvastatin  20 daily MDD: Continue home sertraline  100 daily  FEN/GI: Renal diet with 2L fluid restriction PPx: Heparin  TID Dispo: Home pending clinical improvement . Barriers include diuresis.  Subjective:  NAEON.  Reports he is doing well today and LE and penile edema continue to decrease gradually.  Denies dyspnea.  Objective: Temp:  [97.6 F (36.4 C)-98.3 F (36.8 C)] 97.6 F (36.4 C) (05/13 0417) Pulse Rate:  [79-88] 84 (05/13 0417) Resp:  [18-20] 20 (05/13 0417) BP: (131-141)/(80-93) 140/85 (05/13 0417) SpO2:  [95 %-98 %] 95 % (05/13 0417)  Physical Exam: General: Age-appropriate, resting comfortably in bed, NAD, alert and at baseline. HEENT: No scleral icterus.  MMM. Cardiovascular: Regular rate and rhythm. Normal S1/S2. No murmurs, rubs, or gallops appreciated. Pulmonary: Clear bilaterally to auscultation; no wheezes, crackles, or rhonchi. Normal WOB on room air. Abdominal: Normoactive bowel sounds, nondistended. No tenderness to deep or light palpation. No rebound or guarding. GU: Deferred per discussion with patient. Skin: Warm and dry. Extremities: 2+ peripheral pitting edema bilaterally.  Capillary refill <2 seconds.  Laboratory: Most recent CBC Lab Results  Component Value Date   WBC 7.6 01/22/2024   HGB 8.0 (L) 01/22/2024   HCT 25.4 (L) 01/22/2024   MCV 87.0 01/22/2024   PLT 291 01/22/2024   Most recent BMP    Latest Ref Rng & Units 01/25/2024    5:22 AM  BMP  Glucose 70 - 99 mg/dL 106   BUN 6 - 20 mg/dL 71   Creatinine 2.69 -  1.24 mg/dL 0.98   Sodium 119 - 147 mmol/L 137   Potassium 3.5 - 5.1 mmol/L 3.3   Chloride 98 - 111 mmol/L 107   CO2 22 - 32 mmol/L 17   Calcium  8.9 - 10.3 mg/dL 7.9     Other pertinent labs: -None  New Imaging/Diagnostic Tests: -None  Yuleimy Kretz, MD 01/25/2024, 7:25 AM  PGY-1, [facility] Family Medicine FPTS Intern pager: [phone], text pages welcome Secure chat group  [facility] Teaching Service

## 2024-01-28 ENCOUNTER — Ambulatory Visit (HOSPITAL_COMMUNITY)
Admission: RE | Admit: 2024-01-28 | Discharge: 2024-01-28 | Disposition: A | Discharge status: Home / Self Care | Source: Ambulatory Visit | Attending: Nephrology | Admitting: Nephrology

## 2024-01-28 ENCOUNTER — Encounter (HOSPITAL_COMMUNITY)

## 2024-01-28 DIAGNOSIS — N189 Chronic kidney disease, unspecified: Secondary | ICD-10-CM | POA: Insufficient documentation

## 2024-01-28 DIAGNOSIS — D631 Anemia in chronic kidney disease: Secondary | ICD-10-CM | POA: Diagnosis not present

## 2024-01-28 MED ORDER — IRON SUCROSE 200 MG IVPB - SIMPLE MED
200.0000 mg | Freq: Once | Status: AC
  Administered 2024-01-28: 200 mg via INTRAVENOUS
  Filled 2024-01-28: qty 200

## 2024-02-03 ENCOUNTER — Ambulatory Visit: Payer: Self-pay | Admitting: Podiatry

## 2024-02-04 ENCOUNTER — Encounter (HOSPITAL_COMMUNITY)

## 2024-02-11 ENCOUNTER — Encounter (HOSPITAL_COMMUNITY)

## 2024-02-11 ENCOUNTER — Encounter: Payer: Self-pay | Admitting: Podiatry

## 2024-02-11 ENCOUNTER — Ambulatory Visit (INDEPENDENT_AMBULATORY_CARE_PROVIDER_SITE_OTHER): Payer: Self-pay | Admitting: Podiatry

## 2024-02-11 DIAGNOSIS — B351 Tinea unguium: Secondary | ICD-10-CM | POA: Diagnosis not present

## 2024-02-11 DIAGNOSIS — L84 Corns and callosities: Secondary | ICD-10-CM

## 2024-02-11 DIAGNOSIS — M79676 Pain in unspecified toe(s): Secondary | ICD-10-CM | POA: Diagnosis not present

## 2024-02-11 DIAGNOSIS — E1042 Type 1 diabetes mellitus with diabetic polyneuropathy: Secondary | ICD-10-CM

## 2024-02-11 NOTE — Progress Notes (Signed)
  Subjective:  Patient ID: Stephen Prince, male    DOB: July 12, 1996,  MRN: [id number]  Chief Complaint  Patient presents with   diabeic foot care    Rm17 routine foot care /blood sugar 126/    28 y.o. male presents with the above complaint. History confirmed with patient.  Patient coming in for assistance with thickened dystrophic nails.  Last A1c 6.5.  Symptomatic callus right first toe medial aspect today.  Objective:  Physical Exam: warm, good capillary refill, pedal skin atrophic, decreased pedal hair growth. nail exam onychomycosis of the toenails and dystrophic nails DP pulses palpable, PT pulses palpable, and protective sensation absent, paresthesias noted Left Foot:  Pain with palpation of nails due to elongation and dystrophic growth.  Right Foot: Pain with palpation of nails due to elongation and dystrophic growth.  Plantar medial head of right first proximal phalanx painful callus present +1 pitting edema present bilaterally Assessment:   1. Type 1 diabetes mellitus with diabetic polyneuropathy (HCC)   2. Pre-ulcerative calluses   3. Pain due to onychomycosis of toenail      Plan:  Patient was evaluated and treated and all questions answered.  #Pre ulcerative calluses present right plantar medial first toe level of proximal phalanx head All symptomatic hyperkeratoses x 1 separate lesions were safely debrided with a sterile #312 blade to patient's level of comfort without incident. We discussed preventative and palliative care of these lesions including supportive and accommodative shoegear, padding, prefabricated and custom molded accommodative orthoses, use of a pumice stone and lotions/creams daily.  #Onychomycosis with pain  -Nails palliatively debrided as below. -Educated on self-care  Procedure: Nail Debridement Rationale: Pain Type of Debridement: manual, sharp debridement. Instrumentation: Nail nipper, rotary burr. Number of Nails: 10  Return in about 3 months  (around 05/13/2024) for Diabetic Foot Care.         Eve Hinders, DPM [facility] / [facility]

## 2024-02-13 ENCOUNTER — Encounter: Payer: Self-pay | Admitting: Podiatry

## 2024-02-14 ENCOUNTER — Ambulatory Visit (HOSPITAL_COMMUNITY)
Admission: RE | Admit: 2024-02-14 | Discharge: 2024-02-14 | Disposition: A | Discharge status: Home / Self Care | Attending: Vascular Surgery | Admitting: Vascular Surgery

## 2024-02-14 ENCOUNTER — Other Ambulatory Visit: Payer: Self-pay

## 2024-02-14 ENCOUNTER — Encounter (HOSPITAL_COMMUNITY)
Admission: RE | Disposition: A | Discharge status: Home / Self Care | Payer: Self-pay | Source: Home / Self Care | Attending: Vascular Surgery

## 2024-02-14 DIAGNOSIS — E1022 Type 1 diabetes mellitus with diabetic chronic kidney disease: Secondary | ICD-10-CM | POA: Diagnosis not present

## 2024-02-14 DIAGNOSIS — N179 Acute kidney failure, unspecified: Secondary | ICD-10-CM

## 2024-02-14 DIAGNOSIS — N186 End stage renal disease: Secondary | ICD-10-CM

## 2024-02-14 SURGERY — DIALYSIS/PERMA CATHETER INSERTION
Anesthesia: LOCAL

## 2024-02-14 NOTE — Progress Notes (Signed)
 [facility] [place]  ASSESSMENT / PLAN: 28 y.o. male with CKD 5 in need of permanent dialysis access.  The patient is interested in pursuing peritoneal dialysis.  I think he is a good candidate for this.  Will plan to proceed with laparoscopic peritoneal dialysis catheter placement on Wednesday, 02/16/2024.  CHIEF COMPLAINT: Worsening kidney function  HISTORY OF PRESENT ILLNESS: Stephen Prince is a 28 y.o. male referred to our group for evaluation of peritoneal dialysis catheter placement.  Stephen Prince is a Stephen Prince with a lifelong diagnosis of type 1 diabetes.  He has never had abdominal surgery before.  We reviewed peritoneal dialysis catheter placement.  Past Medical History:  Diagnosis Date   CKD (chronic kidney disease) stage 4, GFR 15-29 ml/min (HCC)    Diabetes mellitus without complication (HCC)     No past surgical history on file.  Family History  Problem Relation Age of Onset   Diabetes Mother    Hypertension Father     Social History   Socioeconomic History   Marital status: Single    Spouse name: Not on file   Number of children: Not on file   Years of education: Not on file   Highest education level: Not on file  Occupational History   Not on file  Tobacco Use   Smoking status: Never   Smokeless tobacco: Never  Vaping Use   Vaping status: Never Used  Substance and Sexual Activity   Alcohol use: Never   Drug use: Never   Sexual activity: Not on file  Other Topics Concern   Not on file  Social History Narrative   Are you right handed or left handed? right   Are you currently employed ?  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 No    Who lives with you? Family    What type of home do you live in: 1 story or 2 story?  2 story stays on 1st level        Social Drivers of Health   Financial Resource Strain: Not on file  Food Insecurity: No Food Insecurity (01/22/2024)    Hunger Vital Sign    Worried About Running Out of Food in the Last Year: Never true    Ran Out of Food in the Last Year: Never true  Transportation Needs: No Transportation Needs (01/22/2024)   PRAPARE - Administrator, Civil Service (Medical): No    Lack of Transportation (Non-Medical): No  Physical Activity: Not on file  Stress: Not on file  Social Connections: Not on file  Intimate Partner Violence: Not At Risk (01/22/2024)   Humiliation, Afraid, Rape, and Kick questionnaire    Fear of Current or Ex-Partner: No    Emotionally Abused: No    Physically Abused: No    Sexually Abused: No    No Known Allergies  No current facility-administered medications for this encounter.    PHYSICAL EXAM Vitals:   02/14/24 0750  BP: 134/85  Pulse: 89  Resp: 12  Temp: 97.8 F (36.6 C)  TempSrc: Oral  SpO2: (!) 89%   Stephen Prince in no distress Regular rate and rhythm Unlabored breathing Soft, nontender abdomen without surgical scar  PERTINENT LABORATORY AND RADIOLOGIC DATA  Most recent CBC    Latest Ref Rng & Units 01/22/2024    7:10 AM 01/17/2024    5:18 PM 01/10/2024  5:16 AM  CBC  WBC 4.0 - 10.5 K/uL 7.6  7.2  8.2   Hemoglobin 13.0 - 17.0 g/dL 8.0  8.5  8.6   Hematocrit 39.0 - 52.0 % 25.4  26.3  27.2   Platelets 150 - 400 K/uL 291  294  271      Most recent CMP    Latest Ref Rng & Units 01/25/2024    5:22 AM 01/24/2024    5:21 AM 01/23/2024    4:50 AM  CMP  Glucose 70 - 99 mg/dL 409  811  914   BUN 6 - 20 mg/dL 71  71  74   Creatinine 0.61 - 1.24 mg/dL 7.82  9.56  2.13   Sodium 135 - 145 mmol/L 137  138  138   Potassium 3.5 - 5.1 mmol/L 3.3  3.3  2.9   Chloride 98 - 111 mmol/L 107  107  106   CO2 22 - 32 mmol/L 17  18  17    Calcium  8.9 - 10.3 mg/dL 7.9  7.5  7.5     Renal function CrCl cannot be calculated (Unknown ideal weight.).  Hgb A1c MFr Bld (%)  Date Value  01/25/2024 6.5 (H)    [name]. [name], MD FACS [facility] Phone Number: [phone] 02/14/2024 8:24 AM   Total time spent on preparing this encounter including chart review, data review, collecting history, examining the patient, and coordinating care: 45 minutes  Portions of this report may have been transcribed using voice recognition software.  Every effort has been made to ensure accuracy; however, inadvertent computerized transcription errors may still be present.

## 2024-02-14 NOTE — H&P (View-Only) (Signed)
 [facility] [place]  ASSESSMENT / PLAN: 28 y.o. male with CKD 5 in need of permanent dialysis access.  The patient is interested in pursuing peritoneal dialysis.  I think he is a good candidate for this.  Will plan to proceed with laparoscopic peritoneal dialysis catheter placement on Wednesday, 02/16/2024.  CHIEF COMPLAINT: Worsening kidney function  HISTORY OF PRESENT ILLNESS: Stephen Prince is a 28 y.o. male referred to our group for evaluation of peritoneal dialysis catheter placement.  Stephen Prince is a young man with a lifelong diagnosis of type 1 diabetes.  He has never had abdominal surgery before.  We reviewed peritoneal dialysis catheter placement.  Past Medical History:  Diagnosis Date   CKD (chronic kidney disease) stage 4, GFR 15-29 ml/min (HCC)    Diabetes mellitus without complication (HCC)     No past surgical history on file.  Family History  Problem Relation Age of Onset   Diabetes Mother    Hypertension Father     Social History   Socioeconomic History   Marital status: Single    Spouse name: Not on file   Number of children: Not on file   Years of education: Not on file   Highest education level: Not on file  Occupational History   Not on file  Tobacco Use   Smoking status: Never   Smokeless tobacco: Never  Vaping Use   Vaping status: Never Used  Substance and Sexual Activity   Alcohol use: Never   Drug use: Never   Sexual activity: Not on file  Other Topics Concern   Not on file  Social History Narrative   Are you right handed or left handed? right   Are you currently employed ?  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 No    Who lives with you? Family    What type of home do you live in: 1 story or 2 story?  2 story stays on 1st level        Social Drivers of Health   Financial Resource Strain: Not on file  Food Insecurity: No Food Insecurity (01/22/2024)    Hunger Vital Sign    Worried About Running Out of Food in the Last Year: Never true    Ran Out of Food in the Last Year: Never true  Transportation Needs: No Transportation Needs (01/22/2024)   PRAPARE - Administrator, Civil Service (Medical): No    Lack of Transportation (Non-Medical): No  Physical Activity: Not on file  Stress: Not on file  Social Connections: Not on file  Intimate Partner Violence: Not At Risk (01/22/2024)   Humiliation, Afraid, Rape, and Kick questionnaire    Fear of Current or Ex-Partner: No    Emotionally Abused: No    Physically Abused: No    Sexually Abused: No    No Known Allergies  No current facility-administered medications for this encounter.    PHYSICAL EXAM Vitals:   02/14/24 0750  BP: 134/85  Pulse: 89  Resp: 12  Temp: 97.8 F (36.6 C)  TempSrc: Oral  SpO2: (!) 89%   Young man in no distress Regular rate and rhythm Unlabored breathing Soft, nontender abdomen without surgical scar  PERTINENT LABORATORY AND RADIOLOGIC DATA  Most recent CBC    Latest Ref Rng & Units 01/22/2024    7:10 AM 01/17/2024    5:18 PM 01/10/2024  5:16 AM  CBC  WBC 4.0 - 10.5 K/uL 7.6  7.2  8.2   Hemoglobin 13.0 - 17.0 g/dL 8.0  8.5  8.6   Hematocrit 39.0 - 52.0 % 25.4  26.3  27.2   Platelets 150 - 400 K/uL 291  294  271      Most recent CMP    Latest Ref Rng & Units 01/25/2024    5:22 AM 01/24/2024    5:21 AM 01/23/2024    4:50 AM  CMP  Glucose 70 - 99 mg/dL 409  811  914   BUN 6 - 20 mg/dL 71  71  74   Creatinine 0.61 - 1.24 mg/dL 7.82  9.56  2.13   Sodium 135 - 145 mmol/L 137  138  138   Potassium 3.5 - 5.1 mmol/L 3.3  3.3  2.9   Chloride 98 - 111 mmol/L 107  107  106   CO2 22 - 32 mmol/L 17  18  17    Calcium  8.9 - 10.3 mg/dL 7.9  7.5  7.5     Renal function CrCl cannot be calculated (Unknown ideal weight.).  Hgb A1c MFr Bld (%)  Date Value  01/25/2024 6.5 (H)    [name]. [name], MD FACS [facility] Phone Number: [phone] 02/14/2024 8:24 AM   Total time spent on preparing this encounter including chart review, data review, collecting history, examining the patient, and coordinating care: 45 minutes  Portions of this report may have been transcribed using voice recognition software.  Every effort has been made to ensure accuracy; however, inadvertent computerized transcription errors may still be present.

## 2024-02-15 ENCOUNTER — Other Ambulatory Visit: Payer: Self-pay

## 2024-02-15 ENCOUNTER — Encounter (HOSPITAL_COMMUNITY): Payer: Self-pay | Admitting: Vascular Surgery

## 2024-02-15 NOTE — Progress Notes (Signed)
 PCP - Roselind Congo, MD  Cardiologist -   PPM/ICD - denies Device Orders - n/a Rep Notified - n/a  Chest x-ray -  EKG - 07-09-23 DOS Stress Test -  ECHO -  Cardiac Cath -   CPAP - Denies    Fasting Blood Sugar - Per patient between 120-160  Checks Blood Sugar Patient has a Dexacom to right arm and  T slim insulin  pump to right abdomen. Patient instructed to reduce basal rate by 20% and may leave pump on since surgery is less than 2 hours  Blood Thinner Instructions: denies Aspirin Instructions: n/a  ERAS Protcol - NPO  COVID TEST- n/a  Anesthesia review: yes, Hx of Dm type 1, CKD,   Patient verbally denies any shortness of breath, fever, cough and chest pain during phone call   -------------  SDW INSTRUCTIONS given:  Your procedure is scheduled on February 16, 2024.  Report to [facility] Main Entrance "A" at 10:40 A.M., and check in at the Admitting office.  Call this number if you have problems the morning of surgery:  [phone]   Remember:  Do not eat or drink  after midnight the night before your surgery    Take these medicines the morning of surgery with A SIP OF WATER  acetaminophen  (TYLENOL )  amLODipine  (NORVASC )  carvedilol  (COREG )  Glucagon (GVOKE HYPOPEN 2-PACK)  hydrALAZINE  (APRESOLINE )  lamoTRIgine  (LAMICTAL )  ondansetron  (ZOFRAN )  sertraline  (ZOLOFT )    As of today, STOP taking any Aspirin (unless otherwise instructed by your surgeon) Aleve, Naproxen, Ibuprofen, Motrin, Advil, Goody's, BC's, all herbal medications, fish oil, and all vitamins. WHAT DO I DO ABOUT MY DIABETES MEDICATION?   Do not take oral diabetes medicines (pills) the morning of surgery.  Dexacom Located on right arm       Insulin  Infusion Pump (T:SLIM X2 INS    PUMP/CONTROL-IQ) DEVI Located to right abdomen patient instructed to reduce basal by 20%. Patient instructed that he could keep pump on and to monitor blood sugar q 2hr   The day of surgery, do not take other  diabetes injectables, including Byetta (exenatide), Bydureon (exenatide ER), Victoza (liraglutide), or Trulicity (dulaglutide).  If your CBG is greater than 220 mg/dL, you may take  of your sliding scale (correction) dose of insulin .   HOW TO MANAGE YOUR DIABETES BEFORE AND AFTER SURGERY  Why is it important to control my blood sugar before and after surgery? Improving blood sugar levels before and after surgery helps healing and can limit problems. A way of improving blood sugar control is eating a healthy diet by:  Eating less sugar and carbohydrates  Increasing activity/exercise  Talking with your doctor about reaching your blood sugar goals High blood sugars (greater than 180 mg/dL) can raise your risk of infections and slow your recovery, so you will need to focus on controlling your diabetes during the weeks before surgery. Make sure that the doctor who takes care of your diabetes knows about your planned surgery including the date and location.  How do I manage my blood sugar before surgery? Check your blood sugar at least 4 times a day, starting 2 days before surgery, to make sure that the level is not too high or low.  Check your blood sugar the morning of your surgery when you wake up and every 2 hours until you get to the Short Stay unit.  If your blood sugar is less than 70 mg/dL, you will need to treat for low blood sugar:  Do not take insulin . Treat a low blood sugar (less than 70 mg/dL) with  cup of clear juice (cranberry or apple), 4 glucose tablets, OR glucose gel. Recheck blood sugar in 15 minutes after treatment (to make sure it is greater than 70 mg/dL). If your blood sugar is not greater than 70 mg/dL on recheck, call [phone] for further instructions. Report your blood sugar to the short stay nurse when you get to Short Stay.  If you are admitted to the hospital after surgery: Your blood sugar will be checked by the staff and you will probably be given insulin   after surgery (instead of oral diabetes medicines) to make sure you have good blood sugar levels. The goal for blood sugar control after surgery is 80-180 mg/dL.                      Do not wear jewelry, make up, or nail polish            Do not wear lotions, powders, perfumes/colognes, or deodorant.            Do not shave 48 hours prior to surgery.  Men may shave face and neck.            Do not bring valuables to the hospital.            [facility] is not responsible for any belongings or valuables.  Do NOT Smoke (Tobacco/Vaping) 24 hours prior to your procedure If you use a CPAP at night, you may bring all equipment for your overnight stay.   Contacts, glasses, dentures or bridgework may not be worn into surgery.      For patients admitted to the hospital, discharge time will be determined by your treatment team.   Patients discharged the day of surgery will not be allowed to drive home, and someone needs to stay with them for 24 hours.    Special instructions:   [facility]- Preparing For Surgery  Before surgery, you can play an important role. Because skin is not sterile, your skin needs to be as free of germs as possible. You can reduce the number of germs on your skin by washing with CHG (chlorahexidine gluconate) Soap before surgery.  CHG is an antiseptic cleaner which kills germs and bonds with the skin to continue killing germs even after washing.    Oral Hygiene is also important to reduce your risk of infection.  Remember - BRUSH YOUR TEETH THE MORNING OF SURGERY WITH YOUR REGULAR TOOTHPASTE  Please do not use if you have an allergy to CHG or antibacterial soaps. If your skin becomes reddened/irritated stop using the CHG.  Do not shave (including legs and underarms) for at least 48 hours prior to first CHG shower. It is OK to shave your face.  Please follow these instructions carefully.   Shower the NIGHT BEFORE SURGERY and the MORNING OF SURGERY with DIAL Soap.   Pat  yourself dry with a CLEAN TOWEL.  Wear CLEAN PAJAMAS to bed the night before surgery  Place CLEAN SHEETS on your bed the night of your first shower and DO NOT SLEEP WITH PETS.   Day of Surgery: Please shower morning of surgery  Wear Clean/Comfortable clothing the morning of surgery Do not apply any deodorants/lotions.   Remember to brush your teeth WITH YOUR REGULAR TOOTHPASTE.   Questions were answered. Patient verbalized understanding of instructions.

## 2024-02-15 NOTE — Anesthesia Preprocedure Evaluation (Signed)
 Anesthesia Evaluation  Patient identified by MRN, date of birth, ID band Patient awake    Reviewed: Allergy & Precautions, NPO status , Patient's Chart, lab work & pertinent test results  Airway Mallampati: II  TM Distance: >3 FB Neck ROM: Full    Dental no notable dental hx.    Pulmonary neg pulmonary ROS   Pulmonary exam normal        Cardiovascular hypertension, Pt. on medications and Pt. on home beta blockers  Rhythm:Regular Rate:Normal     Neuro/Psych Seizures -, Well Controlled,   Anxiety Depression    seizures (04/2023 believed related to hypoglycemia, recurrent seizure 06/2023 and started on lamotrigine )    GI/Hepatic negative GI ROS, Neg liver ROS,,,  Endo/Other  diabetes, Type 1, Insulin  Dependent    Renal/GU CRFRenal disease  negative genitourinary   Musculoskeletal negative musculoskeletal ROS (+)    Abdominal Normal abdominal exam  (+)   Peds  Hematology Lab Results      Component                Value               Date                      WBC                      7.6                 01/22/2024                HGB                      8.5 (L)             02/16/2024                HCT                      25.0 (L)            02/16/2024                MCV                      87.0                01/22/2024                PLT                      291                 01/22/2024             Lab Results      Component                Value               Date                      NA                       135                 02/16/2024  K                        3.8                 02/16/2024                CO2                      17 (L)              01/25/2024                GLUCOSE                  144 (H)             02/16/2024                BUN                      84 (H)              02/16/2024                CREATININE               10.00 (H)           02/16/2024                CALCIUM                    7.9 (L)             01/25/2024                GFRNONAA                 7 (L)               01/25/2024              Anesthesia Other Findings - Dexcom G7 sensor with Novolog  100U/ml pump, reduced to 20% pre-op for surgery.   Reproductive/Obstetrics                             Anesthesia Physical Anesthesia Plan  ASA: 3  Anesthesia Plan: General   Post-op Pain Management:    Induction: Intravenous  PONV Risk Score and Plan: 2 and Ondansetron , Dexamethasone, Midazolam  and Treatment may vary due to age or medical condition  Airway Management Planned: Mask and Oral ETT  Additional Equipment: None  Intra-op Plan:   Post-operative Plan: Extubation in OR  Informed Consent: I have reviewed the patients History and Physical, chart, labs and discussed the procedure including the risks, benefits and alternatives for the proposed anesthesia with the patient or authorized representative who has indicated his/her understanding and acceptance.     Dental advisory given  Plan Discussed with: CRNA  Anesthesia Plan Comments: (PAT note written 02/15/2024 by Danaly Bari, PA-C.  )       Anesthesia Quick Evaluation

## 2024-02-15 NOTE — Progress Notes (Signed)
 Anesthesia Chart Review: Stephen Prince  Case: [id number] Date/Time: 02/16/24 1256   Procedure: LAPAROSCOPIC INSERTION CONTINUOUS AMBULATORY PERITONEAL DIALYSIS  (CAPD) CATHETER   Anesthesia type: Monitor Anesthesia Care   Diagnosis: ESRD (end stage renal disease) (HCC) [N18.6]   Pre-op diagnosis: ESRD   [facility] ROOM 16 / [facility] OR   Surgeons: Carlene Che, MD       DISCUSSION: Patient is a 28 year old male scheduled for the above procedure. CKD stage V with admission 01/07/24-01/10/24 for acute renal failure imposed on CKD V. Losartan  discontinued and hydralazine  added to Coreg  and amlodipine  regimen for HTN. Received IV iron  for anemia.   History includes never smoker, DM1 (diagnosed ~ age 11 years old), nephrotic syndrome, CKD (stage V), seizures (04/2023 believed related to hypoglycemia, recurrent seizure 06/2023 and started on lamotrigine ),   ED visit 01/17/24 for penile edema. Evaluated by urology and had CT abd/pelvis and US  scrotum (see below). Edema felt likely secondary to his nephrotic syndrome and CKD resulting in overall peripheral edema. No urine retention noted. Advised he follow-up with nephrology in 2 days as scheduled. Subsequently was admitted 01/22/24 - 01/25/24 for worsening leg, grown, abdominal swelling despite starting torsemide  per nephrology.  He was started on high dose IV Lasix  for improved diuresis. No uremic symptoms to indicated need for emergent dialysis. Discharged on torsemide  40 mg BID with plans for follow-up at [facility] and [facility] for Transplant Evaluation. He has since been referred to [facility] for consideration of dialysis access. Patient would like to pursue peritoneal dialysis.   A1c 6.5% on 01/25/24. He has a Dexcom G7 sensor and a Novolog  100 unit/mL insulin  pump. He reduce basal rate by 20% while NPO for surgery (see SDW Instructions sheet).   Anesthesia team to evaluate on the day of surgery. As of 01/22/24, H/H 8.0/25.4,    VS:  Wt  Readings from Last 3 Encounters:  01/28/24 84.4 kg  01/25/24 83.2 kg  01/07/24 72.8 kg   BP Readings from Last 3 Encounters:  02/14/24 134/85  01/28/24 131/71  01/25/24 (!) 151/98   Pulse Readings from Last 3 Encounters:  02/14/24 89  01/28/24 75  01/25/24 85     PROVIDERS: Roselind Congo, MD is PCP  Rayfield Cairo, MD is neurologist  Cristi Donalds, MD is nephrologist   LABS: For iSTAT day of surgery. Most recent labs in [facility] include: Lab Results  Component Value Date   WBC 7.6 01/22/2024   HGB 8.0 (L) 01/22/2024   HCT 25.4 (L) 01/22/2024   PLT 291 01/22/2024   GLUCOSE 130 (H) 01/25/2024   ALT 19 01/22/2024   AST 17 01/22/2024   NA 137 01/25/2024   K 3.3 (L) 01/25/2024   CL 107 01/25/2024   CREATININE 9.30 (H) 01/25/2024   BUN 71 (H) 01/25/2024   CO2 17 (L) 01/25/2024   INR 1.1 01/07/2024   HGBA1C 6.5 (H) 01/25/2024    OTHER: EEG 09/13/23: IMPRESSION: This 24-hour ambulatory video EEG study is normal.   CLINICAL CORRELATION: A normal EEG does not exclude a clinical diagnosis of epilepsy.  If further clinical questions remain, inpatient video EEG monitoring may be helpful.   IMAGES: CT Abd/pelvis 01/22/24: IMPRESSION: 1. Persistent bilateral pleural effusions, right greater than left, with pulmonary edema. 2. Diffuse body wall edema and edema involving the subcutaneous soft tissues of the imaged portions of the lower extremities. 3. Diffuse scrotal edema and edema involving the penis. 4. Small volume of free fluid within  the dependent portion of the pelvis. Mild perihepatic ascites overlying the anterior dome. 5. Nonobstructing right renal calculi.   1V PCXR 01/22/24: FINDINGS: Heart size is normal. Small pleural effusions and mild interstitial edema identified. Atelectasis noted in the lung bases. No consolidative changes. Visualized osseous structures are unremarkable. IMPRESSION: Small pleural effusions and mild interstitial edema.  US  Scrotum  01/17/24: IMPRESSION: No evidence of testicular torsion. Nonspecific increased vascularity edema of the scrotal wall bilaterally. Correlate clinically for focal local inflammatory changes and cellulitis   MRI Brain 06/26/23: IMPRESSION: 1. Unremarkable appearance of the brain. 2. Mucosal thickening and small volume fluid in the paranasal sinuses, correlate for acute sinusitis. Bilateral mastoid effusions.    EKG:  EKG 01/31/24 ([facility]): Per Result Narrative in Care Everywhere: Sinus rhythm  Probably within normal limits  No previous ECGs available  Confirmed by Emerson Hanly  70  on 01-31-2024 3 39 50 PM   EKG 07/09/23: Sinus tachycardia at 106 bpm Borderline T wave abnormalities No significant change since last tracing Confirmed by Eldon Greenland ([id number]) on 07/09/2023 4:00:16 AM   CV: N/A  Past Medical History:  Diagnosis Date   Anxiety    CKD (chronic kidney disease) stage 4, GFR 15-29 ml/min (HCC)    Depression    Diabetes mellitus without complication (HCC)     No past surgical history on file.  MEDICATIONS: No current facility-administered medications for this encounter.    acetaminophen  (TYLENOL ) 325 MG tablet   amLODipine  (NORVASC ) 10 MG tablet   calcitRIOL  (ROCALTROL ) 0.25 MCG capsule   carvedilol  (COREG ) 12.5 MG tablet   Continuous Glucose Sensor (DEXCOM G7 SENSOR) MISC   ferrous sulfate  325 (65 FE) MG EC tablet   Glucagon (GVOKE HYPOPEN 2-PACK) 1 MG/0.2ML SOAJ   hydrALAZINE  (APRESOLINE ) 25 MG tablet   insulin  aspart (NOVOLOG ) 100 UNIT/ML injection   Insulin  Infusion Pump (T:SLIM X2 INS PUMP/CONTROL-IQ) DEVI   lamoTRIgine  (LAMICTAL ) 25 MG tablet   metolazone (ZAROXOLYN) 2.5 MG tablet   ondansetron  (ZOFRAN ) 4 MG tablet   potassium chloride  (KLOR-CON ) 20 MEQ packet   rosuvastatin  (CRESTOR ) 20 MG tablet   senna-docusate (SENOKOT-S) 8.6-50 MG tablet   sertraline  (ZOLOFT ) 100 MG tablet   sodium bicarbonate  650 MG tablet   torsemide  (DEMADEX ) 20 MG tablet    Vitamin D, Ergocalciferol, (DRISDOL) 1.25 MG (50000 UNIT) CAPS capsule     Ella Gun, PA-C Surgical Short Stay/Anesthesiology [facility] Phone [phone] [facility] Phone [phone] 02/15/2024 12:07 PM

## 2024-02-16 ENCOUNTER — Ambulatory Visit (HOSPITAL_COMMUNITY): Payer: Self-pay | Admitting: Vascular Surgery

## 2024-02-16 ENCOUNTER — Encounter (HOSPITAL_COMMUNITY): Payer: Self-pay | Admitting: Vascular Surgery

## 2024-02-16 ENCOUNTER — Other Ambulatory Visit: Payer: Self-pay

## 2024-02-16 ENCOUNTER — Other Ambulatory Visit (HOSPITAL_COMMUNITY): Payer: Self-pay

## 2024-02-16 ENCOUNTER — Encounter (HOSPITAL_COMMUNITY)
Admission: AD | Disposition: A | Discharge status: Home / Self Care | Payer: Self-pay | Source: Home / Self Care | Attending: Family Medicine

## 2024-02-16 ENCOUNTER — Inpatient Hospital Stay (HOSPITAL_COMMUNITY)
Admission: AD | Admit: 2024-02-16 | Discharge: 2024-02-19 | DRG: 673 | Disposition: A | Discharge status: Home / Self Care | Attending: Family Medicine | Admitting: Family Medicine

## 2024-02-16 DIAGNOSIS — E1022 Type 1 diabetes mellitus with diabetic chronic kidney disease: Secondary | ICD-10-CM | POA: Diagnosis present

## 2024-02-16 DIAGNOSIS — Z794 Long term (current) use of insulin: Secondary | ICD-10-CM

## 2024-02-16 DIAGNOSIS — F419 Anxiety disorder, unspecified: Secondary | ICD-10-CM | POA: Diagnosis present

## 2024-02-16 DIAGNOSIS — F32A Depression, unspecified: Secondary | ICD-10-CM | POA: Diagnosis present

## 2024-02-16 DIAGNOSIS — J9811 Atelectasis: Secondary | ICD-10-CM | POA: Diagnosis present

## 2024-02-16 DIAGNOSIS — E876 Hypokalemia: Secondary | ICD-10-CM | POA: Diagnosis not present

## 2024-02-16 DIAGNOSIS — Z992 Dependence on renal dialysis: Secondary | ICD-10-CM | POA: Diagnosis not present

## 2024-02-16 DIAGNOSIS — I120 Hypertensive chronic kidney disease with stage 5 chronic kidney disease or end stage renal disease: Secondary | ICD-10-CM

## 2024-02-16 DIAGNOSIS — N186 End stage renal disease: Secondary | ICD-10-CM | POA: Diagnosis not present

## 2024-02-16 DIAGNOSIS — Z79899 Other long term (current) drug therapy: Secondary | ICD-10-CM

## 2024-02-16 DIAGNOSIS — G40909 Epilepsy, unspecified, not intractable, without status epilepticus: Secondary | ICD-10-CM | POA: Diagnosis present

## 2024-02-16 DIAGNOSIS — Z8249 Family history of ischemic heart disease and other diseases of the circulatory system: Secondary | ICD-10-CM

## 2024-02-16 DIAGNOSIS — E8770 Fluid overload, unspecified: Secondary | ICD-10-CM | POA: Insufficient documentation

## 2024-02-16 DIAGNOSIS — N185 Chronic kidney disease, stage 5: Principal | ICD-10-CM | POA: Diagnosis present

## 2024-02-16 DIAGNOSIS — J9601 Acute respiratory failure with hypoxia: Secondary | ICD-10-CM | POA: Diagnosis not present

## 2024-02-16 DIAGNOSIS — Z9641 Presence of insulin pump (external) (internal): Secondary | ICD-10-CM | POA: Diagnosis present

## 2024-02-16 DIAGNOSIS — Z833 Family history of diabetes mellitus: Secondary | ICD-10-CM

## 2024-02-16 DIAGNOSIS — I10 Essential (primary) hypertension: Secondary | ICD-10-CM | POA: Diagnosis present

## 2024-02-16 DIAGNOSIS — E109 Type 1 diabetes mellitus without complications: Secondary | ICD-10-CM | POA: Diagnosis present

## 2024-02-16 DIAGNOSIS — R569 Unspecified convulsions: Secondary | ICD-10-CM

## 2024-02-16 DIAGNOSIS — D631 Anemia in chronic kidney disease: Secondary | ICD-10-CM | POA: Diagnosis present

## 2024-02-16 DIAGNOSIS — N179 Acute kidney failure, unspecified: Secondary | ICD-10-CM | POA: Diagnosis present

## 2024-02-16 HISTORY — DX: Depression, unspecified: F32.A

## 2024-02-16 HISTORY — DX: Anxiety disorder, unspecified: F41.9

## 2024-02-16 LAB — GLUCOSE, CAPILLARY
Glucose-Capillary: 140 mg/dL — ABNORMAL HIGH (ref 70–99)
Glucose-Capillary: 126 mg/dL — ABNORMAL HIGH (ref 70–99)
Glucose-Capillary: 119 mg/dL — ABNORMAL HIGH (ref 70–99)
Glucose-Capillary: 128 mg/dL — ABNORMAL HIGH (ref 70–99)

## 2024-02-16 LAB — POCT I-STAT, CHEM 8
BUN: 84 mg/dL — ABNORMAL HIGH (ref 6–20)
Calcium, Ion: 0.92 mmol/L — ABNORMAL LOW (ref 1.15–1.40)
Chloride: 99 mmol/L (ref 98–111)
Creatinine, Ser: 10 mg/dL — ABNORMAL HIGH (ref 0.61–1.24)
Glucose, Bld: 144 mg/dL — ABNORMAL HIGH (ref 70–99)
HCT: 25 % — ABNORMAL LOW (ref 39.0–52.0)
Hemoglobin: 8.5 g/dL — ABNORMAL LOW (ref 13.0–17.0)
Potassium: 3.8 mmol/L (ref 3.5–5.1)
Sodium: 135 mmol/L (ref 135–145)
TCO2: 21 mmol/L — ABNORMAL LOW (ref 22–32)

## 2024-02-16 SURGERY — LAPAROSCOPIC INSERTION CONTINUOUS AMBULATORY PERITONEAL DIALYSIS  (CAPD) CATHETER
Anesthesia: General | Site: Abdomen

## 2024-02-16 MED ORDER — CARVEDILOL 12.5 MG PO TABS
12.5000 mg | ORAL_TABLET | Freq: Once | ORAL | Status: DC
  Filled 2024-02-16: qty 1

## 2024-02-16 MED ORDER — LIDOCAINE HCL (PF) 1 % IJ SOLN
INTRAMUSCULAR | Status: AC
Start: 2024-02-16 — End: ?
  Filled 2024-02-16: qty 30

## 2024-02-16 MED ORDER — ONDANSETRON HCL 4 MG/2ML IJ SOLN: 4.0000 mg | Freq: Four times a day (QID) | INTRAMUSCULAR | Status: DC | PRN

## 2024-02-16 MED ORDER — CHLORHEXIDINE GLUCONATE 4 % EX SOLN: 60.0000 mL | Freq: Once | CUTANEOUS | Status: DC

## 2024-02-16 MED ORDER — HYDRALAZINE HCL 20 MG/ML IJ SOLN: 5.0000 mg | INTRAMUSCULAR | Status: DC | PRN

## 2024-02-16 MED ORDER — HYDROMORPHONE HCL 1 MG/ML IJ SOLN: 0.2500 mg | INTRAMUSCULAR | Status: DC | PRN

## 2024-02-16 MED ORDER — DEXMEDETOMIDINE HCL IN NACL 80 MCG/20ML IV SOLN
INTRAVENOUS | Status: DC | PRN
  Administered 2024-02-16: 8 ug via INTRAVENOUS
  Administered 2024-02-16: 4 ug via INTRAVENOUS

## 2024-02-16 MED ORDER — ALUM & MAG HYDROXIDE-SIMETH 200-200-20 MG/5ML PO SUSP: 15.0000 mL | ORAL | Status: DC | PRN

## 2024-02-16 MED ORDER — EPHEDRINE 5 MG/ML INJ
INTRAVENOUS | Status: AC
  Filled 2024-02-16: qty 5

## 2024-02-16 MED ORDER — SUCCINYLCHOLINE CHLORIDE 200 MG/10ML IV SOSY
PREFILLED_SYRINGE | INTRAVENOUS | Status: AC
Start: 2024-02-16 — End: ?
  Filled 2024-02-16: qty 10

## 2024-02-16 MED ORDER — SODIUM CHLORIDE 0.9 % IR SOLN
Status: DC | PRN
  Administered 2024-02-16: 3000 mL

## 2024-02-16 MED ORDER — OXYCODONE-ACETAMINOPHEN 5-325 MG PO TABS
1.0000 | ORAL_TABLET | Freq: Four times a day (QID) | ORAL | 0 refills | Status: AC | PRN
  Filled 2024-02-16 – 2024-02-21 (×2): qty 20, 5d supply, fill #0

## 2024-02-16 MED ORDER — LAMOTRIGINE 100 MG PO TABS
50.0000 mg | ORAL_TABLET | Freq: Once | ORAL | Status: DC
  Filled 2024-02-16: qty 2

## 2024-02-16 MED ORDER — METOPROLOL TARTRATE 5 MG/5ML IV SOLN: 2.0000 mg | INTRAVENOUS | Status: DC | PRN

## 2024-02-16 MED ORDER — OXYCODONE-ACETAMINOPHEN 5-325 MG PO TABS
1.0000 | ORAL_TABLET | Freq: Four times a day (QID) | ORAL | Status: DC | PRN
  Administered 2024-02-16 – 2024-02-17 (×2): 1 via ORAL
  Filled 2024-02-16 (×2): qty 1

## 2024-02-16 MED ORDER — AMLODIPINE BESYLATE 10 MG PO TABS
10.0000 mg | ORAL_TABLET | Freq: Every day | ORAL | Status: DC
  Administered 2024-02-17 – 2024-02-19 (×3): 10 mg via ORAL
  Filled 2024-02-16 (×3): qty 1

## 2024-02-16 MED ORDER — ONDANSETRON HCL 4 MG/2ML IJ SOLN
INTRAMUSCULAR | Status: AC
  Filled 2024-02-16: qty 2

## 2024-02-16 MED ORDER — DEXAMETHASONE SODIUM PHOSPHATE 10 MG/ML IJ SOLN
INTRAMUSCULAR | Status: DC | PRN
  Administered 2024-02-16: 4 mg via INTRAVENOUS

## 2024-02-16 MED ORDER — ONDANSETRON HCL 4 MG/2ML IJ SOLN
INTRAMUSCULAR | Status: DC | PRN
  Administered 2024-02-16: 4 mg via INTRAVENOUS

## 2024-02-16 MED ORDER — ACETAMINOPHEN 325 MG PO TABS
325.0000 mg | ORAL_TABLET | ORAL | Status: DC | PRN
  Administered 2024-02-17: 325 mg via ORAL
  Filled 2024-02-16: qty 1

## 2024-02-16 MED ORDER — PROPOFOL 10 MG/ML IV BOLUS
INTRAVENOUS | Status: AC
  Filled 2024-02-16: qty 20

## 2024-02-16 MED ORDER — SODIUM CHLORIDE 0.9 % IV SOLN: INTRAVENOUS | Status: DC

## 2024-02-16 MED ORDER — ACETAMINOPHEN 10 MG/ML IV SOLN
1000.0000 mg | Freq: Once | INTRAVENOUS | Status: DC | PRN
Start: 2024-02-16 — End: 2024-02-16

## 2024-02-16 MED ORDER — LACTATED RINGERS IV SOLN: INTRAVENOUS | Status: DC

## 2024-02-16 MED ORDER — SODIUM CHLORIDE 0.9% FLUSH: 3.0000 mL | INTRAVENOUS | Status: DC | PRN

## 2024-02-16 MED ORDER — PHENYLEPHRINE 80 MCG/ML (10ML) SYRINGE FOR IV PUSH (FOR BLOOD PRESSURE SUPPORT)
PREFILLED_SYRINGE | INTRAVENOUS | Status: AC
  Filled 2024-02-16: qty 20

## 2024-02-16 MED ORDER — ORAL CARE MOUTH RINSE: 15.0000 mL | Freq: Once | OROMUCOSAL | Status: AC

## 2024-02-16 MED ORDER — PROPOFOL 10 MG/ML IV BOLUS
INTRAVENOUS | Status: DC | PRN
  Administered 2024-02-16: 150 mg via INTRAVENOUS

## 2024-02-16 MED ORDER — SUGAMMADEX SODIUM 200 MG/2ML IV SOLN
INTRAVENOUS | Status: DC | PRN
  Administered 2024-02-16: 200 mg via INTRAVENOUS

## 2024-02-16 MED ORDER — CHLORHEXIDINE GLUCONATE 0.12 % MT SOLN: 15.0000 mL | Freq: Once | OROMUCOSAL | Status: AC

## 2024-02-16 MED ORDER — MIDAZOLAM HCL 2 MG/2ML IJ SOLN
INTRAMUSCULAR | Status: AC
  Filled 2024-02-16: qty 2

## 2024-02-16 MED ORDER — CEFAZOLIN SODIUM-DEXTROSE 2-4 GM/100ML-% IV SOLN
2.0000 g | INTRAVENOUS | Status: AC
  Administered 2024-02-16: 2 g via INTRAVENOUS

## 2024-02-16 MED ORDER — LIDOCAINE 2% (20 MG/ML) 5 ML SYRINGE
INTRAMUSCULAR | Status: DC | PRN
  Administered 2024-02-16: 60 mg via INTRAVENOUS

## 2024-02-16 MED ORDER — FENTANYL CITRATE (PF) 250 MCG/5ML IJ SOLN
INTRAMUSCULAR | Status: AC
  Filled 2024-02-16: qty 5

## 2024-02-16 MED ORDER — ACETAMINOPHEN 650 MG RE SUPP: 325.0000 mg | RECTAL | Status: DC | PRN

## 2024-02-16 MED ORDER — ROCURONIUM BROMIDE 10 MG/ML (PF) SYRINGE
PREFILLED_SYRINGE | INTRAVENOUS | Status: DC | PRN
  Administered 2024-02-16: 10 mg via INTRAVENOUS
  Administered 2024-02-16: 50 mg via INTRAVENOUS

## 2024-02-16 MED ORDER — CEFAZOLIN SODIUM-DEXTROSE 2-4 GM/100ML-% IV SOLN
INTRAVENOUS | Status: AC
  Filled 2024-02-16: qty 100

## 2024-02-16 MED ORDER — 0.9 % SODIUM CHLORIDE (POUR BTL) OPTIME
TOPICAL | Status: DC | PRN
  Administered 2024-02-16: 1000 mL

## 2024-02-16 MED ORDER — CARVEDILOL 12.5 MG PO TABS
12.5000 mg | ORAL_TABLET | Freq: Two times a day (BID) | ORAL | Status: DC
  Administered 2024-02-17 – 2024-02-19 (×5): 12.5 mg via ORAL
  Filled 2024-02-16 (×5): qty 1

## 2024-02-16 MED ORDER — LIDOCAINE 2% (20 MG/ML) 5 ML SYRINGE
INTRAMUSCULAR | Status: AC
  Filled 2024-02-16: qty 5

## 2024-02-16 MED ORDER — DEXAMETHASONE SODIUM PHOSPHATE 10 MG/ML IJ SOLN
INTRAMUSCULAR | Status: AC
Start: 2024-02-16 — End: ?
  Filled 2024-02-16: qty 1

## 2024-02-16 MED ORDER — MIDAZOLAM HCL 2 MG/2ML IJ SOLN
INTRAMUSCULAR | Status: DC | PRN
  Administered 2024-02-16: 2 mg via INTRAVENOUS

## 2024-02-16 MED ORDER — PANTOPRAZOLE SODIUM 40 MG PO TBEC
40.0000 mg | DELAYED_RELEASE_TABLET | Freq: Every day | ORAL | Status: DC
  Administered 2024-02-16 – 2024-02-19 (×4): 40 mg via ORAL
  Filled 2024-02-16 (×4): qty 1

## 2024-02-16 MED ORDER — ROCURONIUM BROMIDE 10 MG/ML (PF) SYRINGE
PREFILLED_SYRINGE | INTRAVENOUS | Status: AC
  Filled 2024-02-16: qty 10

## 2024-02-16 MED ORDER — FENTANYL CITRATE (PF) 250 MCG/5ML IJ SOLN
INTRAMUSCULAR | Status: DC | PRN
  Administered 2024-02-16: 50 ug via INTRAVENOUS
  Administered 2024-02-16: 100 ug via INTRAVENOUS

## 2024-02-16 MED ORDER — CHLORHEXIDINE GLUCONATE 0.12 % MT SOLN
OROMUCOSAL | Status: AC
  Administered 2024-02-16: 15 mL via OROMUCOSAL
  Filled 2024-02-16: qty 15

## 2024-02-16 MED ORDER — INSULIN PUMP
SUBCUTANEOUS | Status: DC
  Administered 2024-02-17 (×2): 2 via SUBCUTANEOUS
  Administered 2024-02-17: 1 via SUBCUTANEOUS
  Administered 2024-02-18: 1.5 via SUBCUTANEOUS
  Administered 2024-02-18: 2 via SUBCUTANEOUS
  Administered 2024-02-18: 4 via SUBCUTANEOUS
  Administered 2024-02-18: 2 via SUBCUTANEOUS
  Administered 2024-02-19: 1.5 via SUBCUTANEOUS
  Administered 2024-02-19: 2 via SUBCUTANEOUS
  Filled 2024-02-16: qty 1

## 2024-02-16 MED ORDER — LAMOTRIGINE 100 MG PO TABS
50.0000 mg | ORAL_TABLET | Freq: Two times a day (BID) | ORAL | Status: DC
  Administered 2024-02-16 – 2024-02-19 (×6): 50 mg via ORAL
  Filled 2024-02-16 (×6): qty 1

## 2024-02-16 MED ORDER — LABETALOL HCL 5 MG/ML IV SOLN: 10.0000 mg | INTRAVENOUS | Status: DC | PRN

## 2024-02-16 SURGICAL SUPPLY — 35 items
BAG DECANTER FOR FLEXI CONT (MISCELLANEOUS) ×1 IMPLANT
BIOPATCH RED 1 DISK 7.0 (GAUZE/BANDAGES/DRESSINGS) ×1 IMPLANT
CATH EXTENDED DIALYSIS (CATHETERS) IMPLANT
CHLORAPREP W/TINT 26 (MISCELLANEOUS) ×1 IMPLANT
COVER SURGICAL LIGHT HANDLE (MISCELLANEOUS) ×1 IMPLANT
DERMABOND ADVANCED .7 DNX12 (GAUZE/BANDAGES/DRESSINGS) ×1 IMPLANT
DRSG TEGADERM 4X4.75 (GAUZE/BANDAGES/DRESSINGS) ×3 IMPLANT
ELECTRODE REM PT RTRN 9FT ADLT (ELECTROSURGICAL) ×1 IMPLANT
GAUZE SPONGE 4X4 12PLY STRL (GAUZE/BANDAGES/DRESSINGS) ×1 IMPLANT
GLOVE SURG UNDER LTX SZ7.5 (GLOVE) ×1 IMPLANT
GOWN STRL REUS W/ TWL LRG LVL3 (GOWN DISPOSABLE) ×2 IMPLANT
GOWN STRL REUS W/ TWL XL LVL3 (GOWN DISPOSABLE) ×1 IMPLANT
GRASPER SUT TROCAR 14GX15 (MISCELLANEOUS) ×1 IMPLANT
IRRIGATION SUCT STRKRFLW 2 WTP (MISCELLANEOUS) IMPLANT
IV NS 1000ML BAXH (IV SOLUTION) ×1 IMPLANT
KIT BASIN OR (CUSTOM PROCEDURE TRAY) ×1 IMPLANT
KIT TURNOVER KIT B (KITS) ×1 IMPLANT
NDL INSUFFLATION 14GA 120MM (NEEDLE) ×1 IMPLANT
NEEDLE INSUFFLATION 14GA 120MM (NEEDLE) ×1 IMPLANT
NS IRRIG 1000ML POUR BTL (IV SOLUTION) ×1 IMPLANT
PAD ARMBOARD POSITIONER FOAM (MISCELLANEOUS) ×2 IMPLANT
SET CYSTO W/LG BORE CLAMP LF (SET/KITS/TRAYS/PACK) ×1 IMPLANT
SET TUBE SMOKE EVAC HIGH FLOW (TUBING) ×1 IMPLANT
SLEEVE Z-THREAD 5X100MM (TROCAR) ×1 IMPLANT
SPIKE FLUID TRANSFER (MISCELLANEOUS) ×1 IMPLANT
SUT MNCRL AB 4-0 PS2 18 (SUTURE) ×1 IMPLANT
SUT PROLENE 0 SH 30 (SUTURE) ×2 IMPLANT
SUT SILK 0 TIES 10X30 (SUTURE) ×1 IMPLANT
TAPE CLOTH 4X10 WHT NS (GAUZE/BANDAGES/DRESSINGS) IMPLANT
TOWEL GREEN STERILE (TOWEL DISPOSABLE) ×1 IMPLANT
TOWEL GREEN STERILE FF (TOWEL DISPOSABLE) ×1 IMPLANT
TRAY LAPAROSCOPIC MC (CUSTOM PROCEDURE TRAY) ×1 IMPLANT
TROCAR 5MMX150MM (TROCAR) ×1 IMPLANT
TROCAR Z-THREAD OPTICAL 5X100M (TROCAR) ×1 IMPLANT
WATER STERILE IRR 1000ML POUR (IV SOLUTION) ×1 IMPLANT

## 2024-02-16 NOTE — Transfer of Care (Signed)
 Immediate Anesthesia Transfer of Care Note  Patient: Stephen Prince  Procedure(s) Performed: LAPAROSCOPIC INSERTION CONTINUOUS AMBULATORY PERITONEAL DIALYSIS  (CAPD) CATHETER (Abdomen) OMENTOPEXY, LAPAROSCOPIC (Abdomen)  Patient Location: PACU  Anesthesia Type:General  Level of Consciousness: drowsy  Airway & Oxygen Therapy: Patient Spontanous Breathing and Patient connected to face mask oxygen  Post-op Assessment: Report given to RN and Post -op Vital signs reviewed and stable  Post vital signs: Reviewed and stable  Last Vitals:  Vitals Value Taken Time  BP 135/82 02/16/24 1800  Temp 36.4 C 02/16/24 1705  Pulse 71 02/16/24 1802  Resp 13 02/16/24 1802  SpO2 92 % 02/16/24 1802  Vitals shown include unfiled device data.  Last Pain:  Vitals:   02/16/24 1735  TempSrc:   PainSc: 0-No pain         Complications: No notable events documented.

## 2024-02-16 NOTE — Anesthesia Procedure Notes (Signed)
 Procedure Name: Intubation Date/Time: 02/16/2024 3:24 PM  Performed by: Candance Certain, CRNAPre-anesthesia Checklist: Patient identified, Emergency Drugs available, Suction available and Patient being monitored Patient Re-evaluated:Patient Re-evaluated prior to induction Oxygen Delivery Method: Circle System Utilized Preoxygenation: Pre-oxygenation with 100% oxygen Induction Type: IV induction Ventilation: Mask ventilation without difficulty Laryngoscope Size: Mac and 4 Grade View: Grade II Tube type: Oral Tube size: 7.0 mm Number of attempts: 1 Airway Equipment and Method: Stylet Placement Confirmation: ETT inserted through vocal cords under direct vision, positive ETCO2 and breath sounds checked- equal and bilateral Secured at: 23 cm Tube secured with: Tape Dental Injury: Teeth and Oropharynx as per pre-operative assessment  Comments: Atraumatic induction/intubation. Dentition and oral mucosa as per preop.

## 2024-02-16 NOTE — H&P (Signed)
 See same day progress note for H&P details.   Stephen Prince. [name], MD [facility] Phone Number: [phone] 02/16/2024 8:39 AM

## 2024-02-16 NOTE — Interval H&P Note (Signed)
 History and Physical Interval Note:  02/16/2024 2:52 PM  Stephen Prince  has presented today for surgery, with the diagnosis of End Stage Renal Disease.  The various methods of treatment have been discussed with the patient and family. After consideration of risks, benefits and other options for treatment, the patient has consented to  Procedure(s): LAPAROSCOPIC INSERTION CONTINUOUS AMBULATORY PERITONEAL DIALYSIS  (CAPD) CATHETER (N/A) as a surgical intervention.  The patient's history has been reviewed, patient examined, no change in status, stable for surgery.  I have reviewed the patient's chart and labs.  Questions were answered to the patient's satisfaction.     Carlene Che

## 2024-02-16 NOTE — Op Note (Signed)
 DATE OF SERVICE: 02/16/2024  PATIENT:  Stephen Prince  28 y.o. male  PRE-OPERATIVE DIAGNOSIS:  CKD V  POST-OPERATIVE DIAGNOSIS:  Same  PROCEDURE:   1) laparoscopic omentopexy 2) laparoscopic peritoneal dialysis catheter placement  SURGEON:  Surgeons and Role:    * Carlene Che, MD - Primary  ASSISTANT: Wynonia Hedges, PA-C  An experienced assistant was required given the complexity of this procedure and the standard of surgical care. My assistant helped with exposure through counter tension, suctioning, ligation and retraction to better visualize the surgical field.  My assistant expedited sewing during the case by following my sutures. Wherever I use the term "we" in the report, my assistant actively helped me with that portion of the procedure.  ANESTHESIA:   general  EBL:  BLOOD ADMINISTERED:none  DRAINS: none   LOCAL MEDICATIONS USED:  NONE  SPECIMEN:  none  COUNTS: confirmed correct.  TOURNIQUET:  none   PATIENT DISPOSITION:  PACU - hemodynamically stable.   Delay start of Pharmacological VTE agent (>24hrs) due to surgical blood loss or risk of bleeding: no  INDICATION FOR PROCEDURE: Stephen Prince is a 28 y.o. male with CKD V nearing ESRD. He is in need of permanent dialysis acces. After careful discussion of risks, benefits, and alternatives the patient was offered laparoscopic peritoneal dialysis catheter placement. The patient understood and wished to proceed.  OPERATIVE FINDINGS: hepatomegaly. Small rent in liver during trocar placement rendered hemostatic with cautery. Good result from PD catheter placement.  DESCRIPTION OF PROCEDURE: After identification of the patient in the pre-operative holding area, the patient was transferred to the operating room. The patient was positioned supine on the operating room table. Anesthesia was induced. The abdomen was prepped and draped in standard fashion. A surgical pause was performed confirming correct patient,  procedure, and operative location.  A Veress needle was introduced into the abdomen at Palmer's point, immediately below the left costal margin.  A saline drop test was used to confirm intra-abdominal position.  The Veress needle was connected to insufflation tubing and insufflation initiated.  A low opening pressure and good flow rate were noted.  A 5 mm trocar was introduced into the right upper quadrant using Visi-View technique.  The obturator was removed and the abdomen inspected with a 30 degree angled laparoscope.  No evidence of Veress needle injury was noted. A rent in the right lateral liver was noted. An additional 5 mm trocar was inserted a handsbreadth away to facilitate the case. The rent in the liver was coagulated and rendered hemostatic.  The omentum was grasped with an atraumatic grasper and elevated to the upper abdomen.  A laparoscopic suture passer was used to deliver a 0 Prolene suture through the omentum.  The suture was secured down to the anterior abdominal wall with a knot.  The omentum was pexied in place.  The abdomen was desufflated.  The peritoneal catheter was measured across the abdominal wall surface.  A mark was made by the cuff in the periumbilical abdomen.  The abdomen was reinsufflated.  An advantage 5 mm trocar was then used to create a skiving path through the anterior rectus fascia, rectus muscle, and peritoneum.  The laparoscopic trocar entered in the suprapubic abdomen directing towards the pelvis.  The working end of the catheter was delivered through the trocar.  The cuff was brought into the abdomen and then placed back into the rectus muscle.  The abdomen was desufflated again.  A "swan-neck" extension tubing for  the dialysis catheter was brought onto the field.  The catheter was laid across the desufflated abdomen to lay across the upper quadrant and exit the left abdomen.  The catheter was cut.  The 2 pieces of catheter were then [facility] using a Christmas tree  adapter.  This was secured in place with 2 interrupted sutures of 0 Prolene.  The connected catheter was then tunneled to a counterincision in the upper quadrant and then out the lateral abdomen in a downward deflection.  Great care was taken to avoid twisting or kinking the catheter.  The abdomen was reinsufflated.  The peritoneum was inspected to ensure the catheter did not enter the cavity.  Satisfied we desufflated the abdomen.  The catheter was connected to cystoscopy tubing.  The catheter flushed and drained without any difficulty.  The trochars were removed.  All incisions were closed with interrupted 4-0 Monocryl sutures.  Dermabond was applied.  A sterile bandage was applied to the peritoneal dialysis catheter.   Upon completion of the case instrument and sharps counts were confirmed correct. The patient was transferred to the PACU in good condition. I was present for all portions of the procedure.  FOLLOW UP PLAN: follow up as needed for catheter dysfunction.  Stephen Little. [name], MD [facility] Phone Number: [phone] 02/16/2024 8:28 PM

## 2024-02-17 ENCOUNTER — Encounter (HOSPITAL_COMMUNITY): Payer: Self-pay | Admitting: Vascular Surgery

## 2024-02-17 ENCOUNTER — Other Ambulatory Visit (HOSPITAL_COMMUNITY): Payer: Self-pay

## 2024-02-17 DIAGNOSIS — Z9641 Presence of insulin pump (external) (internal): Secondary | ICD-10-CM | POA: Diagnosis present

## 2024-02-17 DIAGNOSIS — Z9889 Other specified postprocedural states: Secondary | ICD-10-CM

## 2024-02-17 DIAGNOSIS — J9601 Acute respiratory failure with hypoxia: Secondary | ICD-10-CM | POA: Diagnosis not present

## 2024-02-17 DIAGNOSIS — R569 Unspecified convulsions: Secondary | ICD-10-CM

## 2024-02-17 DIAGNOSIS — E1069 Type 1 diabetes mellitus with other specified complication: Secondary | ICD-10-CM

## 2024-02-17 DIAGNOSIS — Z8249 Family history of ischemic heart disease and other diseases of the circulatory system: Secondary | ICD-10-CM | POA: Diagnosis not present

## 2024-02-17 DIAGNOSIS — Z79899 Other long term (current) drug therapy: Secondary | ICD-10-CM | POA: Diagnosis not present

## 2024-02-17 DIAGNOSIS — N179 Acute kidney failure, unspecified: Secondary | ICD-10-CM | POA: Diagnosis present

## 2024-02-17 DIAGNOSIS — G40909 Epilepsy, unspecified, not intractable, without status epilepticus: Secondary | ICD-10-CM | POA: Diagnosis present

## 2024-02-17 DIAGNOSIS — Z833 Family history of diabetes mellitus: Secondary | ICD-10-CM | POA: Diagnosis not present

## 2024-02-17 DIAGNOSIS — Z794 Long term (current) use of insulin: Secondary | ICD-10-CM | POA: Diagnosis not present

## 2024-02-17 DIAGNOSIS — D631 Anemia in chronic kidney disease: Secondary | ICD-10-CM

## 2024-02-17 DIAGNOSIS — J9811 Atelectasis: Secondary | ICD-10-CM | POA: Diagnosis present

## 2024-02-17 DIAGNOSIS — N186 End stage renal disease: Secondary | ICD-10-CM | POA: Diagnosis present

## 2024-02-17 DIAGNOSIS — F32A Depression, unspecified: Secondary | ICD-10-CM | POA: Diagnosis present

## 2024-02-17 DIAGNOSIS — F419 Anxiety disorder, unspecified: Secondary | ICD-10-CM

## 2024-02-17 DIAGNOSIS — I10 Essential (primary) hypertension: Secondary | ICD-10-CM

## 2024-02-17 DIAGNOSIS — N185 Chronic kidney disease, stage 5: Secondary | ICD-10-CM | POA: Diagnosis not present

## 2024-02-17 DIAGNOSIS — I120 Hypertensive chronic kidney disease with stage 5 chronic kidney disease or end stage renal disease: Secondary | ICD-10-CM | POA: Diagnosis present

## 2024-02-17 DIAGNOSIS — E8770 Fluid overload, unspecified: Secondary | ICD-10-CM

## 2024-02-17 DIAGNOSIS — E1022 Type 1 diabetes mellitus with diabetic chronic kidney disease: Secondary | ICD-10-CM | POA: Diagnosis present

## 2024-02-17 DIAGNOSIS — E876 Hypokalemia: Secondary | ICD-10-CM | POA: Diagnosis not present

## 2024-02-17 LAB — CBC
HCT: 24.1 % — ABNORMAL LOW (ref 39.0–52.0)
Hemoglobin: 7.6 g/dL — ABNORMAL LOW (ref 13.0–17.0)
MCH: 27.3 pg (ref 26.0–34.0)
MCHC: 31.5 g/dL (ref 30.0–36.0)
MCV: 86.7 fL (ref 80.0–100.0)
Platelets: 342 10*3/uL (ref 150–400)
RBC: 2.78 MIL/uL — ABNORMAL LOW (ref 4.22–5.81)
RDW: 13.3 % (ref 11.5–15.5)
WBC: 8.2 10*3/uL (ref 4.0–10.5)
nRBC: 0 % (ref 0.0–0.2)

## 2024-02-17 LAB — GLUCOSE, CAPILLARY
Glucose-Capillary: 119 mg/dL — ABNORMAL HIGH (ref 70–99)
Glucose-Capillary: 133 mg/dL — ABNORMAL HIGH (ref 70–99)
Glucose-Capillary: 140 mg/dL — ABNORMAL HIGH (ref 70–99)
Glucose-Capillary: 141 mg/dL — ABNORMAL HIGH (ref 70–99)
Glucose-Capillary: 158 mg/dL — ABNORMAL HIGH (ref 70–99)
Glucose-Capillary: 171 mg/dL — ABNORMAL HIGH (ref 70–99)
Glucose-Capillary: 198 mg/dL — ABNORMAL HIGH (ref 70–99)

## 2024-02-17 LAB — RENAL FUNCTION PANEL
Albumin: 2.2 g/dL — ABNORMAL LOW (ref 3.5–5.0)
Anion gap: 18 — ABNORMAL HIGH (ref 5–15)
BUN: 90 mg/dL — ABNORMAL HIGH (ref 6–20)
CO2: 19 mmol/L — ABNORMAL LOW (ref 22–32)
Calcium: 7.4 mg/dL — ABNORMAL LOW (ref 8.9–10.3)
Chloride: 99 mmol/L (ref 98–111)
Creatinine, Ser: 9.37 mg/dL — ABNORMAL HIGH (ref 0.61–1.24)
GFR, Estimated: 7 mL/min — ABNORMAL LOW (ref >60)
Glucose, Bld: 119 mg/dL — ABNORMAL HIGH (ref 70–99)
Phosphorus: 11 mg/dL — ABNORMAL HIGH (ref 2.5–4.6)
Potassium: 3.2 mmol/L — ABNORMAL LOW (ref 3.5–5.1)
Sodium: 136 mmol/L (ref 135–145)

## 2024-02-17 MED ORDER — HYDRALAZINE HCL 25 MG PO TABS
25.0000 mg | ORAL_TABLET | Freq: Three times a day (TID) | ORAL | Status: DC
  Administered 2024-02-17 – 2024-02-19 (×7): 25 mg via ORAL
  Filled 2024-02-17 (×7): qty 1

## 2024-02-17 MED ORDER — POTASSIUM CHLORIDE 20 MEQ PO PACK
40.0000 meq | PACK | Freq: Once | ORAL | Status: AC
  Administered 2024-02-17: 40 meq via ORAL
  Filled 2024-02-17: qty 2

## 2024-02-17 MED ORDER — SERTRALINE HCL 100 MG PO TABS
100.0000 mg | ORAL_TABLET | Freq: Every day | ORAL | Status: DC
  Administered 2024-02-17 – 2024-02-19 (×3): 100 mg via ORAL
  Filled 2024-02-17 (×3): qty 1

## 2024-02-17 MED ORDER — ROSUVASTATIN CALCIUM 20 MG PO TABS
20.0000 mg | ORAL_TABLET | Freq: Every day | ORAL | Status: DC
  Administered 2024-02-17 – 2024-02-18 (×2): 20 mg via ORAL
  Filled 2024-02-17 (×2): qty 1

## 2024-02-17 MED ORDER — FUROSEMIDE 10 MG/ML IJ SOLN
120.0000 mg | Freq: Two times a day (BID) | INTRAVENOUS | Status: DC
  Administered 2024-02-17 – 2024-02-19 (×5): 120 mg via INTRAVENOUS
  Filled 2024-02-17: qty 12
  Filled 2024-02-17: qty 10
  Filled 2024-02-17: qty 120
  Filled 2024-02-17: qty 2
  Filled 2024-02-17 (×2): qty 10

## 2024-02-17 MED ORDER — TORSEMIDE 20 MG PO TABS
100.0000 mg | ORAL_TABLET | Freq: Every day | ORAL | Status: DC
  Administered 2024-02-17: 100 mg via ORAL
  Filled 2024-02-17: qty 5

## 2024-02-17 MED ORDER — LABETALOL HCL 5 MG/ML IV SOLN: 5.0000 mg | INTRAVENOUS | Status: DC | PRN

## 2024-02-17 MED ORDER — METOLAZONE 5 MG PO TABS
2.5000 mg | ORAL_TABLET | Freq: Every day | ORAL | Status: DC
  Administered 2024-02-17 – 2024-02-19 (×3): 2.5 mg via ORAL
  Filled 2024-02-17 (×3): qty 1

## 2024-02-17 MED ORDER — DARBEPOETIN ALFA 100 MCG/0.5ML IJ SOSY
100.0000 ug | PREFILLED_SYRINGE | Freq: Once | INTRAMUSCULAR | Status: AC
  Administered 2024-02-17: 100 ug via SUBCUTANEOUS
  Filled 2024-02-17: qty 0.5

## 2024-02-17 NOTE — Inpatient Diabetes Management (Signed)
 Inpatient Diabetes Program Recommendations  AACE/ADA: New Consensus Statement on Inpatient Glycemic Control (2015)  Target Ranges:  Prepandial:   less than 140 mg/dL      Peak postprandial:   less than 180 mg/dL (1-2 hours)      Critically ill patients:  140 - 180 mg/dL   Lab Results  Component Value Date   GLUCAP 133 (H) 02/17/2024   HGBA1C 6.5 (H) 01/25/2024    Latest Reference Range & Units 02/16/24 10:29 02/16/24 12:43 02/16/24 17:11 02/16/24 21:11 02/16/24 23:35 02/17/24 03:33 02/17/24 08:07  Glucose-Capillary 70 - 99 mg/dL 161 (H) 096 (H) 045 (H) 128 (H) 141 (H) 171 (H) 133 (H)  (H): Data is abnormally high  Diabetes history: T1DM Outpatient Diabetes medications:  T-Slim insulin  pump with the Dexcom G7 Basal rate-0.45/hr (total 10.8 units daily) Insulin  carb ratio-1:12 Insulin  sensitivity factor-40 Target BG-110  Inpatient Diabetes Program Recommendations:   Pt sees Stephen Prince for Diabetes and sees her every 3 months  CBGs stable on insulin  pump.  Thank you, Stephen Petras E. Virgina Deakins, RN, MSN, CDCES  Diabetes Coordinator Inpatient Glycemic Control Team Team Pager [phone] (8am-5pm) 02/17/2024 9:59 AM

## 2024-02-17 NOTE — Progress Notes (Addendum)
  Progress Note    02/17/2024 6:33 AM 1 Day Post-Op  Subjective:  says his breathing feels ok.  Is sore in his abdomen.  RN reports she tried to wean of O2 overnight but he dropped to 86% and had to put O2 back on.  He states he was on 80mg  Demadex  bid and now he is on 100mg  daily.    Afebrile HR 70's-90's  130's-150's systolic 96% 2LO2NC  Vitals:   02/16/24 2333 02/17/24 0332  BP: (!) 155/99 (!) 152/95  Pulse: 88 93  Resp: 17 20  Temp: 97.6 F (36.4 C) 97.6 F (36.4 C)  SpO2: 95% 91%    Physical Exam: General:  no distress Lungs:  non labored Incisions:  abdominal incisions are clean and intact   CBC    Component Value Date/Time   WBC 7.6 01/22/2024 0710   RBC 2.92 (L) 01/22/2024 0710   HGB 8.5 (L) 02/16/2024 1055   HCT 25.0 (L) 02/16/2024 1055   PLT 291 01/22/2024 0710   MCV 87.0 01/22/2024 0710   MCH 27.4 01/22/2024 0710   MCHC 31.5 01/22/2024 0710   RDW 13.2 01/22/2024 0710   LYMPHSABS 1.6 01/22/2024 0710   MONOABS 0.8 01/22/2024 0710   EOSABS 0.3 01/22/2024 0710   BASOSABS 0.1 01/22/2024 0710    BMET    Component Value Date/Time   NA 135 02/16/2024 1055   K 3.8 02/16/2024 1055   CL 99 02/16/2024 1055   CO2 17 (L) 01/25/2024 0522   GLUCOSE 144 (H) 02/16/2024 1055   BUN 84 (H) 02/16/2024 1055   CREATININE 10.00 (H) 02/16/2024 1055   CALCIUM  7.9 (L) 01/25/2024 0522   GFRNONAA 7 (L) 01/25/2024 0522    INR    Component Value Date/Time   INR 1.1 01/07/2024 1326     Intake/Output Summary (Last 24 hours) at 02/17/2024 1610 Last data filed at 02/17/2024 0514 Gross per 24 hour  Intake 1080 ml  Output 730 ml  Net 350 ml      Assessment/Plan:  28 y.o. male is s/p:  Laparoscopic PD cath placement  1 Day Post-Op   -pt admitted for decreased O2 saturations.  He is on demadex  100mg  daily at home.  I have reordered this for this morning as well as other antihypertensives.  His insulin  pump was reordered last evening.   -discussed with RN to walk  with pt and and with pt to continue using his incentive spirometer.   -he states he has [facility] and goes tomorrow to get flush.  He will be going to [address] center.  -discharge home once weaned off O2.  F/u with [facility] as needed.    Maryanna Smart, PA-C [facility] [phone] 02/17/2024 6:33 AM  VASCULAR STAFF ADDENDUM: I have independently interviewed and examined the patient. I agree with the above.  Will ask nephrology to see today.   [name]. [name], MD [facility] Phone Number: [phone] 02/17/2024 3:35 PM

## 2024-02-17 NOTE — Consult Note (Addendum)
 [facility]  INPATIENT CONSULTATION  Reason for Consultation: CKD 5 Requesting Provider: Dr. [name]  HPI: Stephen Prince is an 28 y.o. 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   Pt referred for outpt PD catheter placement.  Procedure happened in the PM and he was having dyspnea postop so he was admitted overnight obs.  This morning he continues to have dyspnea + O2 requirement.   He's had 30-40lbs of volume gain in the past ~4wks after an episode of hematemesis led to ED visit with IVF, D/C home diuretic.  He has no uremic symptoms or difficulty voiding.   His dad is bedside.   PMH: Past Medical History:  Diagnosis Date   Anxiety    CKD (chronic kidney disease) stage 4, GFR 15-29 ml/min (HCC)    Depression    Diabetes mellitus without complication (HCC)    PSH: History reviewed. No pertinent surgical history.  Past Medical History:  Diagnosis Date   Anxiety    CKD (chronic kidney disease) stage 4, GFR 15-29 ml/min (HCC)    Depression    Diabetes mellitus without complication (HCC)     Medications:  I have reviewed the patient's current medications.  Medications Prior to Admission  Medication Sig Dispense Refill   acetaminophen  (TYLENOL ) 325 MG tablet Take 2 tablets (650 mg total) by mouth every 6 (six) hours as needed for mild pain (pain score 1-3) (or Fever >/= 101).     amLODipine  (NORVASC ) 10 MG tablet Take 1 tablet (10 mg total) by mouth daily. 30 tablet 0   calcitRIOL  (ROCALTROL ) 0.25 MCG capsule Take 0.25 mcg by mouth daily.     carvedilol  (COREG ) 12.5 MG tablet Take 1 tablet (12.5 mg total) by mouth 2 (two) times daily. 60 tablet 0   ferrous sulfate  325 (65 FE) MG EC tablet Take 1 tablet (325 mg total) by mouth 2 (two) times daily. 60 tablet 0   hydrALAZINE  (APRESOLINE ) 25 MG tablet Take 1 tablet (25 mg  total) by mouth 3 (three) times daily. 90 tablet 0   lamoTRIgine  (LAMICTAL ) 25 MG tablet Take 2 tablets (50 mg total) by mouth 2 (two) times daily. 120 tablet 0   metolazone (ZAROXOLYN) 2.5 MG tablet Take by mouth.     potassium chloride  (KLOR-CON ) 20 MEQ packet Dissolve 2 PACKETS (40 mEq total) in 4 ounces of water and drink once daily. 60 packet 0   rosuvastatin  (CRESTOR ) 20 MG tablet Take 20 mg by mouth at bedtime.     senna-docusate (SENOKOT-S) 8.6-50 MG tablet Take 1 tablet by mouth at bedtime as needed for moderate constipation. 30 tablet 0   sertraline  (ZOLOFT ) 100 MG tablet Take 100 mg by mouth daily.     sodium bicarbonate  650 MG tablet Take 650 mg by mouth.     torsemide  (DEMADEX ) 20 MG tablet Take 2 tablets (40 mg total) by mouth 2 (two) times daily. (Patient taking differently: Take 100 mg by mouth once.) 120 tablet 0   Vitamin D, Ergocalciferol, (DRISDOL) 1.25 MG (50000 UNIT) CAPS capsule Take 50,000 Units by mouth once a week. Tuesday     Continuous Glucose Sensor (DEXCOM G7 SENSOR) MISC Inject 1 application  into the skin See admin instructions. Every 10 days     Glucagon (GVOKE HYPOPEN 2-PACK) 1 MG/0.2ML SOAJ Inject 1 pen  into the skin once as needed (For  severe hypoglycemia).     insulin  aspart (NOVOLOG ) 100 UNIT/ML injection Inject 60 Units into the skin daily. Insulin  pump     Insulin  Infusion Pump (T:SLIM X2 INS PUMP/CONTROL-IQ) DEVI 1 Device by Does not apply route every 3 (three) days. Change site every 3 days     ondansetron  (ZOFRAN ) 4 MG tablet Take 1 tablet (4 mg total) by mouth every 6 (six) hours. (Patient not taking: Reported on 02/15/2024) 12 tablet 0    ALLERGIES:  No Known Allergies  FAM HX: Family History  Problem Relation Age of Onset   Diabetes Mother    Hypertension Father     Social History:   reports that he has never smoked. He has never used smokeless tobacco. He reports that he does not drink alcohol and does not use drugs.  ROS: 12 system ROS neg  except per HPI above  Blood pressure (!) 164/100, pulse 88, temperature 98.2 F (36.8 C), temperature source Oral, resp. rate 16, height 5\' 7"  (1.702 m), weight 90.3 kg, SpO2 (!) 86%. PHYSICAL EXAM: Gen: nontoxic but uncomfortable  Eyes: glasses, EOMI ENT: MMM Neck: supple, JVD to mandible CV:  RRR, no rub Abd: PD catheter dressing c/d/I, post op TTP Back: rales to 2/3 up fields GU: deferred, says scrotal edema present Extr: 2+ pitting edema in 4 extr Neuro: AOx3, conversant, no asterixis    Results for orders placed or performed during the hospital encounter of 02/16/24 (from the past 48 hours)  Glucose, capillary     Status: Abnormal   Collection Time: 02/16/24 10:29 AM  Result Value Ref Range   Glucose-Capillary 140 (H) 70 - 99 mg/dL    Comment: Glucose reference range applies only to samples taken after fasting for at least 8 hours.   Comment 1 Notify RN   I-STAT, chem 8     Status: Abnormal   Collection Time: 02/16/24 10:55 AM  Result Value Ref Range   Sodium 135 135 - 145 mmol/L   Potassium 3.8 3.5 - 5.1 mmol/L   Chloride 99 98 - 111 mmol/L   BUN 84 (H) 6 - 20 mg/dL   Creatinine, Ser 40.98 (H) 0.61 - 1.24 mg/dL   Glucose, Bld 119 (H) 70 - 99 mg/dL    Comment: Glucose reference range applies only to samples taken after fasting for at least 8 hours.   Calcium , Ion 0.92 (L) 1.15 - 1.40 mmol/L   TCO2 21 (L) 22 - 32 mmol/L   Hemoglobin 8.5 (L) 13.0 - 17.0 g/dL   HCT 14.7 (L) 82.9 - 56.2 %  Glucose, capillary     Status: Abnormal   Collection Time: 02/16/24 12:43 PM  Result Value Ref Range   Glucose-Capillary 126 (H) 70 - 99 mg/dL    Comment: Glucose reference range applies only to samples taken after fasting for at least 8 hours.  Glucose, capillary     Status: Abnormal   Collection Time: 02/16/24  5:11 PM  Result Value Ref Range   Glucose-Capillary 119 (H) 70 - 99 mg/dL    Comment: Glucose reference range applies only to samples taken after fasting for at least 8  hours.  Glucose, capillary     Status: Abnormal   Collection Time: 02/16/24  9:11 PM  Result Value Ref Range   Glucose-Capillary 128 (H) 70 - 99 mg/dL    Comment: Glucose reference range applies only to samples taken after fasting for at least 8 hours.   Comment 1 Notify RN  Comment 2 Document in Chart   Glucose, capillary     Status: Abnormal   Collection Time: 02/16/24 11:35 PM  Result Value Ref Range   Glucose-Capillary 141 (H) 70 - 99 mg/dL    Comment: Glucose reference range applies only to samples taken after fasting for at least 8 hours.   Comment 1 Notify RN    Comment 2 Document in Chart   Glucose, capillary     Status: Abnormal   Collection Time: 02/17/24  3:33 AM  Result Value Ref Range   Glucose-Capillary 171 (H) 70 - 99 mg/dL    Comment: Glucose reference range applies only to samples taken after fasting for at least 8 hours.   Comment 1 Notify RN    Comment 2 Document in Chart   Glucose, capillary     Status: Abnormal   Collection Time: 02/17/24  8:07 AM  Result Value Ref Range   Glucose-Capillary 133 (H) 70 - 99 mg/dL    Comment: Glucose reference range applies only to samples taken after fasting for at least 8 hours.   Comment 1 Notify RN    Comment 2 Document in Chart     No results found.  Assessment/Plan:  88M type 1 DM, HTN, CKD 5 admitted with hypoxia and volume overload.  **Hypoxia: Requiring O2 [place].  Exam with gross overload, pulmonary rales. Afebrile, no symptoms to suggest other cause. Will diurese with meds for now, if failing will need RRT.   Lasix  120 IV BID + metolazone 2.5 for now - can dial up if not diuresing. Switched to low na/fluid restricted diet.   **CKD 5: nearing need for RRT - had PD catheter placed 6/4 with plans for training in 2 weeks.  Aside from volume he has no indications for RRT, but I think this can be managed with diuretics.  As above if failing diuresis will need to employ RRT - can decide PD vs HD if it's needed; technically  can do low volume supine PD at this point but  increased risk for leak.  Will need PD catheter flushed starting 6/6.  **Anemia:  Hb stable in 8s.  Had IV iron  outpt and ESA was being arranged.  Will dose here, ongoing dosing will be done at [facility].   **HTN: on home meds, BP currently reasonable esp with impending diuresis  **DM: on insulin  pump  Will follow, reach out with concerns.    Baron Border 02/17/2024, 10:40 AM

## 2024-02-17 NOTE — Progress Notes (Signed)
 Patient arrived from PACU to 4E18. Patient is alert and oriented. Parents at bedside. Vitals, tele, and 2 RN skin assessment completed. Pt given prn med for pain score of 5.

## 2024-02-17 NOTE — Consult Note (Signed)
 Initial Consultation Note   Patient: Stephen Prince WUJ:[id number] DOB: 01-06-1996 PCP: Roselind Congo, MD DOA: 02/16/2024 DOS: the patient was seen and examined on 02/17/2024 Primary service: Carlene Che, MD  Referring physician: Carlene Che, MD Reason for consult: Take over his primary team, patient now remaining hospitalized for medical conditions and patient has no further vascular surgery needs.  HPI/Course: Patient with known history of CKD 5 was admitted in April with AKI on CKD 5.  Nephrology saw the patient during admission and have followed the patient up since.  Plan was for starting dialysis with peritoneal dialysis.  Patient had peritoneal dialysis catheter placed laparoscopically yesterday by vascular surgery.  No complications during surgery but patient was short of breath afterwards.  So he was monitored overnight and subsequently developed oxygen requirement overnight.  Nephrology has been consulted and noted the patient has had a 30 to 40 pound weight gain over the last 4 weeks in the setting of his progressive CKD.  This is despite diuretic use.  Initial labs are still pending.  Review of Systems: As per HPI otherwise all other systems reviewed and are negative.  Past Medical History:  Diagnosis Date   Acute kidney injury superimposed on CKD (HCC) 01/07/2024   Anxiety    CKD (chronic kidney disease) stage 4, GFR 15-29 ml/min (HCC)    Depression    Diabetes mellitus without complication (HCC)     Past Surgical History:  Procedure Laterality Date   CAPD INSERTION N/A 02/16/2024   Procedure: LAPAROSCOPIC INSERTION CONTINUOUS AMBULATORY PERITONEAL DIALYSIS  (CAPD) CATHETER;  Surgeon: Carlene Che, MD;  Location: [facility] OR;  Service: Vascular;  Laterality: N/A;    Social History  reports that he has never smoked. He has never used smokeless tobacco. He reports that he does not drink alcohol and does not use drugs.  No Known Allergies  Family History   Problem Relation Age of Onset   Diabetes Mother    Hypertension Father   Reviewed  Prior to Admission medications   Medication Sig Start Date End Date Taking? Authorizing Provider  acetaminophen  (TYLENOL ) 325 MG tablet Take 2 tablets (650 mg total) by mouth every 6 (six) hours as needed for mild pain (pain score 1-3) (or Fever >/= 101). 01/25/24  Yes Clem Currier, DO  amLODipine  (NORVASC ) 10 MG tablet Take 1 tablet (10 mg total) by mouth daily. 01/11/24  Yes Amin, Ankit C, MD  calcitRIOL  (ROCALTROL ) 0.25 MCG capsule Take 0.25 mcg by mouth daily. 01/04/24  Yes [provider]  carvedilol  (COREG ) 12.5 MG tablet Take 1 tablet (12.5 mg total) by mouth 2 (two) times daily. 01/10/24  Yes Amin, Ankit C, MD  ferrous sulfate  325 (65 FE) MG EC tablet Take 1 tablet (325 mg total) by mouth 2 (two) times daily. 01/10/24 02/16/24 Yes Amin, Ankit C, MD  hydrALAZINE  (APRESOLINE ) 25 MG tablet Take 1 tablet (25 mg total) by mouth 3 (three) times daily. 01/10/24 02/16/24 Yes Amin, Ankit C, MD  lamoTRIgine  (LAMICTAL ) 25 MG tablet Take 2 tablets (50 mg total) by mouth 2 (two) times daily. 01/25/24  Yes Clem Currier, DO  metolazone (ZAROXOLYN) 2.5 MG tablet Take by mouth. 02/10/24  Yes [provider]  oxyCODONE -acetaminophen  (PERCOCET) 5-325 MG tablet Take 1 tablet by mouth every 6 (six) hours as needed for severe pain (pain score 7-10). 02/16/24 02/15/25 Yes Baglia, Corrina, PA-C  potassium chloride  (KLOR-CON ) 20 MEQ packet Dissolve 2 PACKETS (40 mEq total) in 4 ounces of  water and drink once daily. 01/25/24 02/24/24 Yes Clem Currier, DO  rosuvastatin  (CRESTOR ) 20 MG tablet Take 20 mg by mouth at bedtime.   Yes [provider]  senna-docusate (SENOKOT-S) 8.6-50 MG tablet Take 1 tablet by mouth at bedtime as needed for moderate constipation. 01/10/24  Yes Amin, Ankit C, MD  sertraline  (ZOLOFT ) 100 MG tablet Take 100 mg by mouth daily.   Yes [provider]  sodium bicarbonate  650 MG tablet Take  650 mg by mouth.   Yes [provider]  torsemide  (DEMADEX ) 20 MG tablet Take 2 tablets (40 mg total) by mouth 2 (two) times daily. Patient taking differently: Take 100 mg by mouth once. 01/25/24  Yes Clem Currier, DO  Vitamin D, Ergocalciferol, (DRISDOL) 1.25 MG (50000 UNIT) CAPS capsule Take 50,000 Units by mouth once a week. Tuesday   Yes [provider]  Continuous Glucose Sensor (DEXCOM G7 SENSOR) MISC Inject 1 application  into the skin See admin instructions. Every 10 days 01/19/24   [provider]  Glucagon (GVOKE HYPOPEN 2-PACK) 1 MG/0.2ML SOAJ Inject 1 pen  into the skin once as needed (For severe hypoglycemia). 05/20/23   [provider]  insulin  aspart (NOVOLOG ) 100 UNIT/ML injection Inject 60 Units into the skin daily. Insulin  pump    [provider]  Insulin  Infusion Pump (T:SLIM X2 INS PUMP/CONTROL-IQ) DEVI 1 Device by Does not apply route every 3 (three) days. Change site every 3 days    [provider]  ondansetron  (ZOFRAN ) 4 MG tablet Take 1 tablet (4 mg total) by mouth every 6 (six) hours. Patient not taking: Reported on 02/15/2024 07/09/23   Janalee Mcmurray, PA-C    Physical Exam: Vitals:   02/17/24 0811 02/17/24 0843 02/17/24 0956 02/17/24 1122  BP: (!) 164/100   (!) 137/91  Pulse: 89 88  80  Resp: 16 16  16   Temp: 98.2 F (36.8 C)   97.8 F (36.6 C)  TempSrc: Oral   Oral  SpO2: 94% 90% (!) 86% 95%  Weight:      Height:        Physical Exam Constitutional:      General: He is not in acute distress.    Appearance: Normal appearance.  HENT:     Head: Normocephalic and atraumatic.     Mouth/Throat:     Mouth: Mucous membranes are moist.     Pharynx: Oropharynx is clear.  Eyes:     Extraocular Movements: Extraocular movements intact.     Pupils: Pupils are equal, round, and reactive to light.  Cardiovascular:     Rate and Rhythm: Normal rate and regular rhythm.     Pulses: Normal pulses.     Heart sounds:  Normal heart sounds.  Pulmonary:     Effort: Pulmonary effort is normal. No respiratory distress.     Breath sounds: Rales present.  Abdominal:     General: Bowel sounds are normal. There is no distension.     Palpations: Abdomen is soft.     Tenderness: There is no abdominal tenderness.  Musculoskeletal:        General: No swelling or deformity.     Right lower leg: Edema present.     Left lower leg: Edema present.  Skin:    General: Skin is warm and dry.  Neurological:     General: No focal deficit present.     Mental Status: Mental status is at baseline.    Labs on Admission: I have  personally reviewed following labs and imaging studies  CBC: Recent Labs  Lab 02/16/24 1055 02/17/24 1106  WBC  --  8.2  HGB 8.5* 7.6*  HCT 25.0* 24.1*  MCV  --  86.7  PLT  --  342    Basic Metabolic Panel: Recent Labs  Lab 02/16/24 1055  NA 135  K 3.8  CL 99  GLUCOSE 144*  BUN 84*  CREATININE 10.00*    GFR: Estimated Creatinine Clearance: 12 mL/min (A) (by C-G formula based on SCr of 10 mg/dL (H)).  Liver Function Tests: No results for input(s): "AST", "ALT", "ALKPHOS", "BILITOT", "PROT", "ALBUMIN" in the last 168 hours.  Urine analysis:    Component Value Date/Time   COLORURINE STRAW (A) 01/22/2024 0726   APPEARANCEUR HAZY (A) 01/22/2024 0726   LABSPEC 1.009 01/22/2024 0726   PHURINE 5.0 01/22/2024 0726   GLUCOSEU 150 (A) 01/22/2024 0726   HGBUR NEGATIVE 01/22/2024 0726   BILIRUBINUR NEGATIVE 01/22/2024 0726   KETONESUR NEGATIVE 01/22/2024 0726   PROTEINUR >=300 (A) 01/22/2024 0726   NITRITE NEGATIVE 01/22/2024 0726   LEUKOCYTESUR NEGATIVE 01/22/2024 0726    Radiological Exams on Admission: No results found.  EKG: Independently reviewed.  Yesterday's EKG showed sinus rhythm at 94 bpm.  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  Monitored overnight and developed oxygen requirement. > Noted to be 30 to 40 pounds volume overloaded in the past 4 weeks.  Despite home diuretic. > Nephrology consulted and are following, appreciate recommendations - Continue to monitor on continuous pulse ox - Continue with diuresis as ordered - Fluid restricted diet  Hypertension - Continue home amlodipine , carvedilol , hydralazine  - Diuresis as above  Diabetes type 1 - Continue home insulin  pump  Seizure disorder - Continue home Lamictal   Anxiety Depression - Continue home sertraline   Anemia > Of CKD.  Hemoglobin 7.6 today which is down from 8.5 yesterday after PD catheter placement.  Likely hemodilutional and possible that i-STAT yesterday was less accurate. - Trend Hgb - SCDs for now  [facility] will take over as primary team for this patient.  DVT prophylaxis: Continue with SCDs for now, is warm overloaded but hemoglobin has downtrended since yesterday when he had his PD catheter placed.  Code Status:   Full  Family Communication:  Updated at bedside  Disposition Plan:   Patient is from:  Home  Anticipated DC to:  Home  Anticipated DC date:  2-3 Days  Anticipated DC barriers: None  Consults called:  Nephrology, Vascular Surgery (signed off)  Admission status:  Changed to Inpatient, med-surg.   Severity of Illness: The appropriate patient status for this patient is INPATIENT. Inpatient status is judged to be reasonable and necessary in order to provide the required intensity of service to ensure the patient's safety. The patient's presenting symptoms, physical exam findings, and initial radiographic and laboratory data in  the context of their chronic comorbidities is felt to place them at high risk for further clinical deterioration. Furthermore, it is not anticipated that the patient will be medically stable for discharge from the  hospital within 2 midnights of admission.   * I certify that at the point of admission it is my clinical judgment that the patient will require inpatient hospital care spanning beyond 2 midnights from the point of admission due to high intensity of service, high risk for further deterioration and high frequency of surveillance required.Johnetta Nab MD [facility]  How to contact the [facility] Attending or Consulting provider 7A - 7P or covering provider during after hours 7P -7A, for this patient?   Check the care team in [facility] and look for a) attending/consulting [facility] provider listed and b) the [facility] team listed Log into [email or web address] and use [facility] universal password to access. If you do not have the password, please contact the hospital operator. Locate the [facility] provider you are looking for under [facility] and page to a number that you can be directly reached. If you still have difficulty reaching the provider, please page the [facility] (Director on Call) for the Hospitalists listed on amion for assistance.  02/17/2024, 12:43 PM

## 2024-02-17 NOTE — Anesthesia Postprocedure Evaluation (Signed)
 Anesthesia Post Note  Patient: LOGAN BALTIMORE  Procedure(s) Performed: LAPAROSCOPIC INSERTION CONTINUOUS AMBULATORY PERITONEAL DIALYSIS  (CAPD) CATHETER (Abdomen) OMENTOPEXY, LAPAROSCOPIC (Abdomen)     Patient location during evaluation: PACU Anesthesia Type: General Level of consciousness: awake and alert Pain management: pain level controlled Vital Signs Assessment: post-procedure vital signs reviewed and stable Respiratory status: spontaneous breathing, nonlabored ventilation, respiratory function stable and patient connected to nasal cannula oxygen Cardiovascular status: blood pressure returned to baseline and stable Postop Assessment: no apparent nausea or vomiting Anesthetic complications: no   No notable events documented.  Last Vitals:  Vitals:   02/17/24 0843 02/17/24 0956  BP:    Pulse: 88   Resp: 16   Temp:    SpO2: 90% (!) 86%    Last Pain:  Vitals:   02/17/24 0811  TempSrc: Oral  PainSc: 0-No pain                 Valente Gaskin Amelie Caracci

## 2024-02-17 NOTE — Progress Notes (Signed)
 Reached out to PA for insulin  pump order set; placed. Patient has signed insulin  pump contract; placed in chart.   TOC meds were given to main pharmacy; form placed in chart.

## 2024-02-17 NOTE — Progress Notes (Signed)
 While sitting in bed O2 83% on RA. 2L [place] placed back on patient 97% on 2L [place].

## 2024-02-18 DIAGNOSIS — N185 Chronic kidney disease, stage 5: Secondary | ICD-10-CM | POA: Diagnosis not present

## 2024-02-18 LAB — GLUCOSE, CAPILLARY
Glucose-Capillary: 114 mg/dL — ABNORMAL HIGH (ref 70–99)
Glucose-Capillary: 127 mg/dL — ABNORMAL HIGH (ref 70–99)
Glucose-Capillary: 146 mg/dL — ABNORMAL HIGH (ref 70–99)
Glucose-Capillary: 150 mg/dL — ABNORMAL HIGH (ref 70–99)
Glucose-Capillary: 180 mg/dL — ABNORMAL HIGH (ref 70–99)
Glucose-Capillary: 183 mg/dL — ABNORMAL HIGH (ref 70–99)

## 2024-02-18 LAB — CBC
HCT: 24.6 % — ABNORMAL LOW (ref 39.0–52.0)
Hemoglobin: 7.8 g/dL — ABNORMAL LOW (ref 13.0–17.0)
MCH: 27.4 pg (ref 26.0–34.0)
MCHC: 31.7 g/dL (ref 30.0–36.0)
MCV: 86.3 fL (ref 80.0–100.0)
Platelets: 337 10*3/uL (ref 150–400)
RBC: 2.85 MIL/uL — ABNORMAL LOW (ref 4.22–5.81)
RDW: 13.5 % (ref 11.5–15.5)
WBC: 7.1 10*3/uL (ref 4.0–10.5)
nRBC: 0 % (ref 0.0–0.2)

## 2024-02-18 LAB — RENAL FUNCTION PANEL
Albumin: 2.3 g/dL — ABNORMAL LOW (ref 3.5–5.0)
Albumin: 2.2 g/dL — ABNORMAL LOW (ref 3.5–5.0)
Anion gap: 19 — ABNORMAL HIGH (ref 5–15)
Anion gap: 18 — ABNORMAL HIGH (ref 5–15)
BUN: 88 mg/dL — ABNORMAL HIGH (ref 6–20)
BUN: 88 mg/dL — ABNORMAL HIGH (ref 6–20)
CO2: 20 mmol/L — ABNORMAL LOW (ref 22–32)
CO2: 21 mmol/L — ABNORMAL LOW (ref 22–32)
Calcium: 7.5 mg/dL — ABNORMAL LOW (ref 8.9–10.3)
Calcium: 7.3 mg/dL — ABNORMAL LOW (ref 8.9–10.3)
Chloride: 100 mmol/L (ref 98–111)
Chloride: 99 mmol/L (ref 98–111)
Creatinine, Ser: 9.82 mg/dL — ABNORMAL HIGH (ref 0.61–1.24)
Creatinine, Ser: 9.87 mg/dL — ABNORMAL HIGH (ref 0.61–1.24)
GFR, Estimated: 7 mL/min — ABNORMAL LOW (ref >60)
GFR, Estimated: 7 mL/min — ABNORMAL LOW (ref >60)
Glucose, Bld: 110 mg/dL — ABNORMAL HIGH (ref 70–99)
Glucose, Bld: 142 mg/dL — ABNORMAL HIGH (ref 70–99)
Phosphorus: 10.6 mg/dL — ABNORMAL HIGH (ref 2.5–4.6)
Phosphorus: 10.6 mg/dL — ABNORMAL HIGH (ref 2.5–4.6)
Potassium: 3.3 mmol/L — ABNORMAL LOW (ref 3.5–5.1)
Potassium: 3.4 mmol/L — ABNORMAL LOW (ref 3.5–5.1)
Sodium: 139 mmol/L (ref 135–145)
Sodium: 138 mmol/L (ref 135–145)

## 2024-02-18 MED ORDER — GENTAMICIN SULFATE 0.1 % EX CREA
TOPICAL_CREAM | Freq: Once | CUTANEOUS | Status: AC
  Filled 2024-02-18: qty 15

## 2024-02-18 NOTE — Progress Notes (Signed)
 Introduced self to patient and the purpose of a visit. He is very kind and allowed two colleague from [facility] unit to watch and learn PD in and out flushing. He said " well just like them ,I'm going to learn something new. With sterile set-up ,started preparing ,showing and explaining on what I  was doing with verbal explanations. Procedure well tolerated by the patient,no cramping nor discomfort on his belly. Pd tube is patient,flushing in and out very well with clear dialyzate.No blood clots noted. Insertion site look tendered ,but no signs of infection.Old ,soiled dressing changed.Catheter capped and clamped after the procedure.

## 2024-02-18 NOTE — Plan of Care (Signed)

## 2024-02-18 NOTE — Progress Notes (Signed)
 PROGRESS NOTE    Stephen Prince  EAV:[id number] DOB: [birth year] DOA: 02/16/2024 PCP: Roselind Congo, MD   Brief Narrative:  This 28 years old male with PMH significant for known history of CKD stage IV was admitted in with AKI on CKD 5.  Nephrology has evaluated the patient during that admission and did follow the patient up to now.  Plan is to start dialysis with peritoneal dialysis.  Patient had peritoneal dialysis catheter placed laparoscopically by vascular surgery 02/17/2024.  There were no complications during surgery but patient developed shortness of breath afterwards.  So he was monitored overnight and subsequently required oxygen.  Nephrology has been consulted and noted the patient has gained 30 to 40 pounds with over the last 4 weeks in the setting of progressive CKD.  Patient is now admitted for aggressive diuresis.  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 Noted to be 30 to 40 pound volume overloaded in last 4 weeks despite being on diuretics. Nephrology consulted and recommended aggressive diuresis. Fluid restricted diet. Continue IV Lasix  120 mg every 12 hours. Monitor intake output charting. Patient reports improving shortness of breath.  Patient is nearing the need for renal replacement therapy and underwent PD catheter placement 2 days ago.  Hypertension: Continue  amlodipine , carvedilol , hydralazine . Continue Diuresis as above.   Diabetes Mellitus type 1 Continue home  insulin  pump   Seizure disorder: Continue home Lamictal    Anxiety/ Depression: Continue home sertraline    Anemia of CKD: Hemoglobin 7.8 today which is down from 8.5 after PD catheter placement.   - Trend Hgb - SCDs for now.  Hypokalemia: Replaced.  continue to monitor.   DVT prophylaxis: SCDs Code Status: Full code Family Communication: Father at bedside. Disposition Plan:  Status is: Inpatient Remains inpatient appropriate because: Admitted for fluid overload requiring aggressive diuresis.   Consultants:  Nephrology Vascular surgery  Procedures: PD catheter insertion Antimicrobials:  Anti-infectives (From admission, onward)    Start     Dose/Rate Route Frequency Ordered Stop   02/16/24 1015  ceFAZolin (ANCEF) 2-4 GM/100ML-% IVPB       Note to Pharmacy: Catherine Cloud: cabinet override      02/16/24 1015 02/16/24 1535   02/16/24 1013  ceFAZolin (ANCEF) IVPB 2g/100 mL premix        2 g 200 mL/hr over 30 Minutes Intravenous 30 min pre-op 02/16/24 1013 02/16/24 1539      Subjective: Patient was seen and examined at bedside.  Overnight events noted. Patient reports feeling much improved,  still remains on supplemental oxygen. Patient continued on IV diuresis.  Objective: Vitals:   02/17/24 2348 02/18/24 0409 02/18/24 0825 02/18/24 0830  BP: 136/80 138/84 (!) 148/90 (!) 148/90  Pulse: 85 81 83 85  Resp: 16 17 17    Temp: 98.2 F (36.8 C) 98.1 F (36.7 C) 98 F (36.7 C)   TempSrc: Oral Oral Oral   SpO2: 95% 95% 95%   Weight:  87.6 kg    Height:  Intake/Output Summary (Last 24 hours) at 02/18/2024 1037 Last data filed at 02/18/2024 0826 Gross per 24 hour  Intake 604 ml  Output 3325 ml  Net -2721 ml   Filed Weights   02/15/24 1145 02/16/24 1026 02/18/24 0409  Weight: 84.4 kg 90.3 kg 87.6 kg    Examination:  General exam: Appears calm and comfortable, not in any acute distress. Respiratory system: Clear to auscultation. Respiratory effort  normal.  RR 16. Cardiovascular system: S1 & S2 heard, RRR. No JVD, murmurs, rubs, gallops or clicks.  Gastrointestinal system: Abdomen is non distended, soft and non tender.Normal bowel sounds heard. Central nervous system: Alert and oriented x 3. No focal neurological deficits. Extremities: Edema+ , no cyanosis, no clubbing. Skin: No rashes, lesions or ulcers Psychiatry: Judgement and insight appear normal. Mood & affect appropriate.     Data Reviewed: I have personally reviewed following labs and imaging studies  CBC: Recent Labs  Lab 02/16/24 1055 02/17/24 1106 02/18/24 0853  WBC  --  8.2 7.1  HGB 8.5* 7.6* 7.8*  HCT 25.0* 24.1* 24.6*  MCV  --  86.7 86.3  PLT  --  342 337   Basic Metabolic Panel: Recent Labs  Lab 02/16/24 1055 02/17/24 1106 02/18/24 0853  NA 135 136 139  K 3.8 3.2* 3.3*  CL 99 99 100  CO2  --  19* 20*  GLUCOSE 144* 119* 110*  BUN 84* 90* 88*  CREATININE 10.00* 9.37* 9.82*  CALCIUM   --  7.4* 7.5*  PHOS  --  11.0* 10.6*   GFR: Estimated Creatinine Clearance: 12 mL/min (A) (by C-G formula based on SCr of 9.82 mg/dL (H)). Liver Function Tests: Recent Labs  Lab 02/17/24 1106 02/18/24 0853  ALBUMIN 2.2* 2.3*   No results for input(s): "LIPASE", "AMYLASE" in the last 168 hours. No results for input(s): "AMMONIA" in the last 168 hours. Coagulation Profile: No results for input(s): "INR", "PROTIME" in the last 168 hours. Cardiac Enzymes: No results for input(s): "CKTOTAL", "CKMB", "CKMBINDEX", "TROPONINI" in the last 168 hours. BNP (last 3 results) No results for input(s): "PROBNP" in the last 8760 hours. HbA1C: No results for input(s): "HGBA1C" in the last 72 hours. CBG: Recent Labs  Lab 02/17/24 1600 02/17/24 2042 02/17/24 2358 02/18/24 0412 02/18/24 0821  GLUCAP 198* 140* 158* 127* 114*   Lipid Profile: No results for input(s): "CHOL", "HDL", "LDLCALC", "TRIG", "CHOLHDL", "LDLDIRECT" in the last 72 hours. Thyroid  Function Tests: No  results for input(s): "TSH", "T4TOTAL", "FREET4", "T3FREE", "THYROIDAB" in the last 72 hours. Anemia Panel: No results for input(s): "VITAMINB12", "FOLATE", "FERRITIN", "TIBC", "IRON ", "RETICCTPCT" in the last 72 hours. Sepsis Labs: No results for input(s): "PROCALCITON", "LATICACIDVEN" in the last 168 hours.  No results found for this or any previous visit (from the past 240 hours).  Radiology Studies: No results found.  Scheduled Meds:  amLODipine   10 mg Oral Daily   carvedilol   12.5 mg Oral BID   hydrALAZINE   25 mg Oral TID   insulin  pump   Subcutaneous Q4H   lamoTRIgine   50 mg Oral BID   metolazone  2.5 mg Oral Daily   pantoprazole   40 mg Oral Daily   rosuvastatin   20 mg Oral QHS   sertraline   100 mg Oral Daily   Continuous Infusions:  furosemide  120 mg (02/18/24 0829)     LOS: 1 day    Time spent: 50 mins    Magdalene School, MD [facility]   If 7PM-7AM, please contact night-coverage

## 2024-02-18 NOTE — Progress Notes (Signed)
 Patient ID: Stephen Prince, male   DOB: 1996/04/03, 28 y.o.   MRN: [id number] [facility] Progress Note   Assessment/ Plan:   1.  Volume overload with acute hypoxic respiratory failure: With progressive volume overload over the last 4 weeks and admitted to the hospital for aggressive diuresis postoperatively.  He is feeling better this morning and the goal is to continue diuretics for at least another 24 hours and transition to oral diuretics tomorrow to allow for discharge home based on clinical progress/labs. 2.  Chronic kidney disease stage V: Nearing the need for renal replacement therapy and underwent PD catheter placement 2 days ago.  Will order for PD catheter flushing today. 3.  Anemia: Secondary to chronic illness including recent surgery and associated losses.  Resume ESA. 4.  Hypertension: Blood pressures noted to be elevated and the plan is to continue diuresis for volume unloading and assess blood pressure.  Subjective:   Reports to be feeling somewhat better with regards to shortness of breath and has expected postoperative abdominal pain   Objective:   BP (!) 148/90   Pulse 85   Temp 98 F (36.7 C) (Oral)   Resp 17   Ht 5\' 7"  (1.702 m)   Wt 87.6 kg   SpO2 95%   BMI 30.25 kg/m   Intake/Output Summary (Last 24 hours) at 02/18/2024 1005 Last data filed at 02/18/2024 4098 Gross per 24 hour  Intake 604 ml  Output 3325 ml  Net -2721 ml   Weight change: -2.666 kg  Physical Exam: Gen: Resting comfortably in bed, without oxygen supplementation CVS: Pulse regular rhythm, normal rate, S1 and S2 normal Resp: Fine rales bilateral lower lung fields without rhonchi/wheeze Abd: Soft, PD catheter placement site with intact dressing Ext: 2+ pitting edema over upper and lower extremities  Imaging: No results found.  Labs: BMET Recent Labs  Lab 02/16/24 1055 02/17/24 1106 02/18/24 0853  NA 135 136 139  K 3.8 3.2* 3.3*  CL 99 99 100  CO2  --  19* 20*  GLUCOSE  144* 119* 110*  BUN 84* 90* 88*  CREATININE 10.00* 9.37* 9.82*  CALCIUM   --  7.4* 7.5*  PHOS  --  11.0* 10.6*   CBC Recent Labs  Lab 02/16/24 1055 02/17/24 1106 02/18/24 0853  WBC  --  8.2 7.1  HGB 8.5* 7.6* 7.8*  HCT 25.0* 24.1* 24.6*  MCV  --  86.7 86.3  PLT  --  342 337    Medications:     amLODipine   10 mg Oral Daily   carvedilol   12.5 mg Oral BID   hydrALAZINE   25 mg Oral TID   insulin  pump   Subcutaneous Q4H   lamoTRIgine   50 mg Oral BID   metolazone  2.5 mg Oral Daily   pantoprazole   40 mg Oral Daily   rosuvastatin   20 mg Oral QHS   sertraline   100 mg Oral Daily    Clevester Dally, MD 02/18/2024, 10:05 AM

## 2024-02-19 ENCOUNTER — Other Ambulatory Visit (HOSPITAL_COMMUNITY): Payer: Self-pay

## 2024-02-19 DIAGNOSIS — N185 Chronic kidney disease, stage 5: Secondary | ICD-10-CM | POA: Diagnosis not present

## 2024-02-19 LAB — CBC
HCT: 24.6 % — ABNORMAL LOW (ref 39.0–52.0)
Hemoglobin: 7.8 g/dL — ABNORMAL LOW (ref 13.0–17.0)
MCH: 27 pg (ref 26.0–34.0)
MCHC: 31.7 g/dL (ref 30.0–36.0)
MCV: 85.1 fL (ref 80.0–100.0)
Platelets: 332 10*3/uL (ref 150–400)
RBC: 2.89 MIL/uL — ABNORMAL LOW (ref 4.22–5.81)
RDW: 13.3 % (ref 11.5–15.5)
WBC: 7 10*3/uL (ref 4.0–10.5)
nRBC: 0 % (ref 0.0–0.2)

## 2024-02-19 LAB — RENAL FUNCTION PANEL
Albumin: 2.2 g/dL — ABNORMAL LOW (ref 3.5–5.0)
Anion gap: 12 (ref 5–15)
BUN: 83 mg/dL — ABNORMAL HIGH (ref 6–20)
CO2: 23 mmol/L (ref 22–32)
Calcium: 6.6 mg/dL — ABNORMAL LOW (ref 8.9–10.3)
Chloride: 99 mmol/L (ref 98–111)
Creatinine, Ser: 9.83 mg/dL — ABNORMAL HIGH (ref 0.61–1.24)
GFR, Estimated: 7 mL/min — ABNORMAL LOW (ref >60)
Glucose, Bld: 127 mg/dL — ABNORMAL HIGH (ref 70–99)
Phosphorus: 10.4 mg/dL — ABNORMAL HIGH (ref 2.5–4.6)
Potassium: 3.2 mmol/L — ABNORMAL LOW (ref 3.5–5.1)
Sodium: 134 mmol/L — ABNORMAL LOW (ref 135–145)

## 2024-02-19 LAB — BASIC METABOLIC PANEL WITH GFR
Anion gap: 13 (ref 5–15)
BUN: 86 mg/dL — ABNORMAL HIGH (ref 6–20)
CO2: 23 mmol/L (ref 22–32)
Calcium: 6.7 mg/dL — ABNORMAL LOW (ref 8.9–10.3)
Chloride: 98 mmol/L (ref 98–111)
Creatinine, Ser: 9.85 mg/dL — ABNORMAL HIGH (ref 0.61–1.24)
GFR, Estimated: 7 mL/min — ABNORMAL LOW (ref >60)
Glucose, Bld: 122 mg/dL — ABNORMAL HIGH (ref 70–99)
Potassium: 3.2 mmol/L — ABNORMAL LOW (ref 3.5–5.1)
Sodium: 134 mmol/L — ABNORMAL LOW (ref 135–145)

## 2024-02-19 LAB — GLUCOSE, CAPILLARY
Glucose-Capillary: 117 mg/dL — ABNORMAL HIGH (ref 70–99)
Glucose-Capillary: 146 mg/dL — ABNORMAL HIGH (ref 70–99)

## 2024-02-19 LAB — MAGNESIUM: Magnesium: 2.2 mg/dL (ref 1.7–2.4)

## 2024-02-19 LAB — PHOSPHORUS: Phosphorus: 10.5 mg/dL — ABNORMAL HIGH (ref 2.5–4.6)

## 2024-02-19 MED ORDER — TORSEMIDE 20 MG PO TABS
80.0000 mg | ORAL_TABLET | Freq: Two times a day (BID) | ORAL | 0 refills | Status: AC
  Filled 2024-02-19: qty 240, 30d supply, fill #0

## 2024-02-19 MED ORDER — POTASSIUM CHLORIDE 20 MEQ PO PACK
40.0000 meq | PACK | Freq: Once | ORAL | Status: AC
  Administered 2024-02-19: 40 meq via ORAL
  Filled 2024-02-19: qty 2

## 2024-02-19 MED ORDER — POTASSIUM CHLORIDE CRYS ER 20 MEQ PO TBCR
20.0000 meq | EXTENDED_RELEASE_TABLET | ORAL | Status: AC
  Administered 2024-02-19 (×2): 20 meq via ORAL
  Filled 2024-02-19: qty 1

## 2024-02-19 NOTE — Progress Notes (Signed)
 Patient verbalized undrstanding of instructions. All belongings and paperwork given to patient. PD site clean dry intact.

## 2024-02-19 NOTE — Discharge Summary (Signed)
 Physician Discharge Summary  Stephen Prince VHQ:[id number] DOB: [birth year] DOA: 02/16/2024  PCP: Roselind Congo, MD  Admit date: 02/16/2024  Discharge date: 02/19/2024  Admitted From: Home  Disposition:  Home  Recommendations for Outpatient Follow-up:  Follow up with PCP in 1-2 weeks. Please obtain BMP/CBC in one week. Advised to follow-up with Nephrologist in [place]. Advised to take torsemide  80 mg every 12 hours and metolazone  2.5 mg every 12 hours.  Home Health: None Equipment/Devices:PD catheter  Discharge Condition: Stable CODE STATUS:Full code Diet recommendation: Renal diet  Brief Summary/ Hospital Course: This 28 years old male with PMH significant for known history of CKD stage V was admitted in with AKI on CKD 5. Nephrology has evaluated the patient during that admission and did follow the patient up to now. Plan is to start dialysis with peritoneal dialysis. Patient had peritoneal dialysis catheter placed laparoscopically by vascular surgery on 02/17/2024. There were no complications during surgery but patient developed shortness of breath afterwards. So he was monitored overnight and subsequently required oxygen. Nephrology has been consulted and noted the patient has gained 30 to 40 pounds with over the last 4 weeks in the setting of progressive CKD. Patient was admitted for aggressive diuresis.  Patient continued on IV Lasix  120 mg every 12 hours.  Patient has significant urinary output. Feels much improved.  Nephrology recommended patient can be discharged on torsemide  80 mg every 12 hours.  Patient feels better and wants to be discharged home.  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 Noted to be 30 to 40 pound volume overloaded in last 4 weeks despite being on diuretics. Nephrology consulted and recommended aggressive diuresis. Fluid restricted diet. Continue IV Lasix  120 mg every 12 hours. Monitor intake output charting. Patient reports improving shortness of breath.   Patient is nearing the need for renal replacement therapy and underwent PD catheter placement 2 days ago. Patient with significant urinary output.  Nephrology recommended patient can be discharged on torsemide  80 mg q 12 hours.   Hypertension: Continue  amlodipine , carvedilol , hydralazine . Continue Diuresis as above.   Diabetes Mellitus type 1 Continue home insulin  pump   Seizure disorder: Continue home Lamictal    Anxiety/ Depression: Continue home sertraline    Anemia of CKD: Hemoglobin 7.8 today which is down from 8.5 after PD catheter placement.   - Trend Hgb - SCDs for now.   Hypokalemia: Replaced.  continue to monitor.  Discharge Instructions  Discharge Instructions     Call MD for:  difficulty breathing, headache or visual disturbances   Complete by: As directed    Call MD for:  persistant dizziness or light-headedness   Complete by: As directed    Call MD for:  persistant nausea and vomiting   Complete by: As directed    Call MD for:  redness, tenderness, or signs of infection (pain, swelling, bleeding, redness, odor or green/yellow discharge around incision site)   Complete by: As directed    Call MD for:  severe or increased  pain, loss or decreased feeling  in affected limb(s)   Complete by: As directed    Call MD for:  temperature >100.5   Complete by: As directed    Diet - low sodium heart healthy   Complete by: As directed    Diet general   Complete by:  As directed    Renal diet   Discharge instructions   Complete by: As directed    Your catheter can be flushed tomorrow 02/17/24 at your dialysis center   Discharge instructions   Complete by: As directed    Advised follow-up with primary care physician in 1 week. Advised to follow-up with nephrologist in [place]. Advised to take torsemide  80 mg every 12 hours and metolazone  2.5 mg every 12 hours.   Discharge wound care:   Complete by: As directed    Keep incisions dry for 24 hours. You can then wash with mild soap and water, pat dry. Do not soak in bathtub   Driving Restrictions   Complete by: As directed    No driving while taking narcotic pain medication   Increase activity slowly   Complete by: As directed    Walk with assistance use walker or cane as needed   Increase activity slowly   Complete by: As directed    Lifting restrictions   Complete by: As directed    No lifting for 3 weeks   No wound care   Complete by: As directed    Resume previous diet   Complete by: As directed       Allergies as of 02/19/2024   No Known Allergies      Medication List     TAKE these medications    acetaminophen  325 MG tablet Commonly known as: TYLENOL  Take 2 tablets (650 mg total) by mouth every 6 (six) hours as needed for mild pain (pain score 1-3) (or Fever >/= 101).   amLODipine  10 MG tablet Commonly known as: NORVASC  Take 1 tablet (10 mg total) by mouth daily.   calcitRIOL  0.25 MCG capsule Commonly known as: ROCALTROL  Take 0.25 mcg by mouth daily.   carvedilol  12.5 MG tablet Commonly known as: COREG  Take 1 tablet (12.5 mg total) by mouth 2 (two) times daily.   Dexcom G7 Sensor Misc Inject 1 application  into the skin See admin instructions. Every 10 days   ferrous sulfate  325 (65 FE) MG EC tablet Take 1 tablet (325 mg total) by mouth 2 (two) times daily.   Gvoke HypoPen 2-Pack 1 MG/0.2ML Soaj Generic drug: Glucagon Inject 1 pen  into the skin once as needed  (For severe hypoglycemia).   hydrALAZINE  25 MG tablet Commonly known as: APRESOLINE  Take 1 tablet (25 mg total) by mouth 3 (three) times daily.   insulin  aspart 100 UNIT/ML injection Commonly known as: novoLOG  Inject 60 Units into the skin daily. Insulin  pump   lamoTRIgine  25 MG tablet Commonly known as: LAMICTAL  Take 2 tablets (50 mg total) by mouth 2 (two) times daily.   metolazone  2.5 MG tablet Commonly known as: ZAROXOLYN  Take by mouth.   ondansetron  4 MG tablet Commonly known as: ZOFRAN  Take 1 tablet (4 mg total) by mouth every 6 (six) hours.   oxyCODONE -acetaminophen  5-325 MG tablet Commonly known as: Percocet Take 1 tablet by mouth every 6 (six) hours as needed for severe pain (pain score 7-10).   potassium chloride  20 MEQ packet Commonly known as: KLOR-CON  Dissolve 2 PACKETS (40 mEq total) in 4 ounces of water and drink  once daily.   rosuvastatin  20 MG tablet Commonly known as: CRESTOR  Take 20 mg by mouth at bedtime.   senna-docusate 8.6-50 MG tablet Commonly known as: Senokot-S Take 1 tablet by mouth at bedtime as needed for moderate constipation.   sertraline  100 MG tablet Commonly known as: ZOLOFT  Take 100 mg by mouth daily.   sodium bicarbonate  650 MG tablet Take 650 mg by mouth.   T:slim X2 Ins Pump/Control-IQ Devi 1 Device by Does not apply route every 3 (three) days. Change site every 3 days   torsemide  20 MG tablet Commonly known as: DEMADEX  Take 4 tablets (80 mg total) by mouth 2 (two) times daily. What changed: how much to take   Vitamin D (Ergocalciferol) 1.25 MG (50000 UNIT) Caps capsule Commonly known as: DRISDOL Take 50,000 Units by mouth once a week. Tuesday               Discharge Care Instructions  (From admission, onward)           Start     Ordered   02/16/24 0000  Discharge wound care:       Comments: Keep incisions dry for 24 hours. You can then wash with mild soap and water, pat dry. Do not soak in bathtub    02/16/24 1654            Follow-up Information     [facility] Follow up.   Specialty: Vascular Surgery Why: As needed if you have any issues with PD catheter or incisions. For all other PD questions discuss with your Nephrologist Contact information: [address], [address] [place]   [id number] [phone]        Roselind Congo, MD Follow up in 1 week(s).   Specialty: Family Medicine Contact information: [phone] [address] [place] [address] [phone]                No Known Allergies  Consultations: Nephrology   Procedures/Studies: CT ABDOMEN PELVIS WO CONTRAST Result Date: 01/22/2024 CLINICAL DATA:  Kidney failure.  Swelling. EXAM: CT ABDOMEN AND PELVIS WITHOUT CONTRAST TECHNIQUE: Multidetector CT imaging of the abdomen and pelvis was performed following the standard protocol without IV contrast. RADIATION DOSE REDUCTION: This exam was performed according to the departmental dose-optimization program which includes automated exposure control, adjustment of the mA and/or kV according to patient size and/or use of iterative reconstruction technique. COMPARISON:  01/17/2024 FINDINGS: Lower chest: Mild interstitial edema. Persistent bilateral pleural effusions, right greater than left. Bilateral lower lobe atelectasis and ground-glass attenuation overlies the pleural effusions. Hepatobiliary: No focal liver abnormality identified. Gallbladder appears normal. No bile duct dilatation. Pancreas: Unremarkable. No pancreatic ductal dilatation or surrounding inflammatory changes. Spleen: Normal in size without focal abnormality. Adrenals/Urinary Tract: There are 2 punctate stones within the upper pole of the right kidney measuring around 2 mm. No left renal calculi. No kidney mass or obstructive uropathy. Mild bilateral perinephric fat stranding. Urinary bladder appears normal.  Stomach/Bowel: Stomach appears within normal limits. The appendix is visualized and appears normal. No pathologic dilatation of the large or small bowel loops to suggest an obstruction. No bowel wall thickening or inflammation. Vascular/Lymphatic: Normal appearance of the abdominal aorta. Prominent retroperitoneal lymph nodes are identified measuring up to 9 mm short axis. No pathologic adenopathy within the abdomen or pelvis. There is diffuse edema within the small bowel mesentery. Reproductive: Prostate gland appears normal.  Diffuse scrotal edema is identified no discrete fluid collections identified. There is also diffuse edema involving the penis. Other: Small volume of free fluid within the dependent portion of the pelvis, image 73/3. Mild perihepatic ascites overlying the anterior dome. No focal fluid collections noted. No signs of pneumoperitoneum. Musculoskeletal: There is visualized osseous structures are unremarkable. Diffuse body wall edema and edema involving the subcutaneous soft tissues of the imaged portions of the lower extremities. IMPRESSION: 1. Persistent bilateral pleural effusions, right greater than left, with pulmonary edema. 2. Diffuse body wall edema and edema involving the subcutaneous soft tissues of the imaged portions of the lower extremities. 3. Diffuse scrotal edema and edema involving the penis. 4. Small volume of free fluid within the dependent portion of the pelvis. Mild perihepatic ascites overlying the anterior dome. 5. Nonobstructing right renal calculi. Electronically Signed   By: Kimberley Penman M.D.   On: 01/22/2024 09:26   DG Chest Portable 1 View Result Date: 01/22/2024 CLINICAL DATA:  Evaluate for edema. EXAM: PORTABLE CHEST 1 VIEW COMPARISON:  None Available. FINDINGS: Heart size is normal. Small pleural effusions and mild interstitial edema identified. Atelectasis noted in the lung bases. No consolidative changes. Visualized osseous structures are unremarkable.  IMPRESSION: Small pleural effusions and mild interstitial edema. Electronically Signed   By: Kimberley Penman M.D.   On: 01/22/2024 08:52    Subjective: Patient was seen and examined at bedside.  Overnight events noted. Patient reports doing much better,  feels improved and wants to be discharged home today.  Discharge Exam: Vitals:   02/19/24 0748 02/19/24 1221  BP: (!) 163/104 (!) 145/98  Pulse: 81   Resp:  16  Temp: 97.6 F (36.4 C)   SpO2: 95%    Vitals:   02/18/24 2012 02/18/24 2344 02/19/24 0748 02/19/24 1221  BP: (!) 148/91 (!) 156/89 (!) 163/104 (!) 145/98  Pulse: 89 85 81   Resp: 16 20  16   Temp: 97.7 F (36.5 C) 97.9 F (36.6 C) 97.6 F (36.4 C)   TempSrc: Oral Oral Oral   SpO2: 95% 99% 95%   Weight:      Height:        General: Pt is alert, awake, not in acute distress Cardiovascular: RRR, S1/S2 +, no rubs, no gallops Respiratory: CTA bilaterally, no wheezing, no rhonchi Abdominal: Soft, NT, ND, bowel sounds + Extremities: no edema, no cyanosis    The results of significant diagnostics from this hospitalization (including imaging, microbiology, ancillary and laboratory) are listed below for reference.     Microbiology: No results found for this or any previous visit (from the past 240 hours).   Labs: BNP (last 3 results) Recent Labs    01/22/24 0710  BNP 928.4*   Basic Metabolic Panel: Recent Labs  Lab 02/17/24 1106 02/18/24 0853 02/18/24 1025 02/19/24 0257 02/19/24 0258  NA 136 139 138 134* 134*  K 3.2* 3.3* 3.4* 3.2* 3.2*  CL 99 100 99 98 99  CO2 19* 20* 21* 23 23  GLUCOSE 119* 110* 142* 122* 127*  BUN 90* 88* 88* 86* 83*  CREATININE 9.37* 9.82* 9.87* 9.85* 9.83*  CALCIUM  7.4* 7.5* 7.3* 6.7* 6.6*  MG  --   --   --  2.2  --   PHOS 11.0* 10.6* 10.6* 10.5* 10.4*   Liver Function Tests: Recent Labs  Lab 02/17/24 1106 02/18/24 0853 02/18/24 1025 02/19/24 0258  ALBUMIN 2.2* 2.3* 2.2* 2.2*   No results for input(s): "LIPASE",  "AMYLASE" in the last  168 hours. No results for input(s): "AMMONIA" in the last 168 hours. CBC: Recent Labs  Lab 02/16/24 1055 02/17/24 1106 02/18/24 0853 02/19/24 0257  WBC  --  8.2 7.1 7.0  HGB 8.5* 7.6* 7.8* 7.8*  HCT 25.0* 24.1* 24.6* 24.6*  MCV  --  86.7 86.3 85.1  PLT  --  342 337 332   Cardiac Enzymes: No results for input(s): "CKTOTAL", "CKMB", "CKMBINDEX", "TROPONINI" in the last 168 hours. BNP: Invalid input(s): "POCBNP" CBG: Recent Labs  Lab 02/18/24 1617 02/18/24 2013 02/18/24 2347 02/19/24 0402 02/19/24 0848  GLUCAP 183* 146* 180* 117* 146*   D-Dimer No results for input(s): "DDIMER" in the last 72 hours. Hgb A1c No results for input(s): "HGBA1C" in the last 72 hours. Lipid Profile No results for input(s): "CHOL", "HDL", "LDLCALC", "TRIG", "CHOLHDL", "LDLDIRECT" in the last 72 hours. Thyroid  function studies No results for input(s): "TSH", "T4TOTAL", "T3FREE", "THYROIDAB" in the last 72 hours.  Invalid input(s): "FREET3" Anemia work up No results for input(s): "VITAMINB12", "FOLATE", "FERRITIN", "TIBC", "IRON ", "RETICCTPCT" in the last 72 hours. Urinalysis    Component Value Date/Time   COLORURINE STRAW (A) 01/22/2024 0726   APPEARANCEUR HAZY (A) 01/22/2024 0726   LABSPEC 1.009 01/22/2024 0726   PHURINE 5.0 01/22/2024 0726   GLUCOSEU 150 (A) 01/22/2024 0726   HGBUR NEGATIVE 01/22/2024 0726   BILIRUBINUR NEGATIVE 01/22/2024 0726   KETONESUR NEGATIVE 01/22/2024 0726   PROTEINUR >=300 (A) 01/22/2024 0726   NITRITE NEGATIVE 01/22/2024 0726   LEUKOCYTESUR NEGATIVE 01/22/2024 0726   Sepsis Labs Recent Labs  Lab 02/17/24 1106 02/18/24 0853 02/19/24 0257  WBC 8.2 7.1 7.0   Microbiology No results found for this or any previous visit (from the past 240 hours).   Time coordinating discharge: Over 30 minutes  SIGNED:   Magdalene School, MD  [facility] 02/19/2024, 2:41 PM Pager   If 7PM-7AM, please contact night-coverage

## 2024-02-19 NOTE — Progress Notes (Addendum)
 Patient ID: Stephen Prince, male   DOB: 16-Mar-1996, 28 y.o.   MRN: [id number] [facility] Progress Note   Assessment/ Plan:   1.  Volume overload with acute hypoxic respiratory failure: With progressive volume overload over the last 4 weeks and admitted to the hospital for aggressive diuresis postoperatively.  He continues to do well status post IV diuretics and can transition to torsemide  80 mg twice a day with 2.5 mg daily metolazone  at home upon discharge today. 2.  Chronic kidney disease stage V: Nearing the need for renal replacement therapy and underwent PD catheter placement 2 days ago.  Underwent PD catheter flush yesterday without problems and this will be undertaken as an outpatient at [facility] on therapies. 3.  Anemia: Secondary to chronic illness including recent surgery and associated losses.  Resume ESA. 4.  Hypertension: Blood pressures noted to be elevated and the plan is to continue diuresis for volume unloading and assess blood pressure.  Subjective:   Reports that he continues to feel better and ambulated around the hallway this morning.  PD catheter flushed yesterday underwent without problems.   Objective:   BP (!) 163/104 (BP Location: Right Arm)   Pulse 81   Temp 97.6 F (36.4 C) (Oral)   Resp 20   Ht 5\' 7"  (1.702 m)   Wt 87.6 kg   SpO2 95%   BMI 30.25 kg/m   Intake/Output Summary (Last 24 hours) at 02/19/2024 0902 Last data filed at 02/19/2024 0406 Gross per 24 hour  Intake 772.4 ml  Output 2550 ml  Net -1777.6 ml   Weight change:   Physical Exam: Gen: Comfortably resting in bed, father at bedside. CVS: Pulse regular rhythm, normal rate, S1 and S2 normal Resp: Fine rales bilateral lower lung fields without rhonchi/wheeze Abd: Soft, PD catheter placement site with intact dressing Ext: 2+ pitting edema over upper and lower extremities  Imaging: No results found.  Labs: BMET Recent Labs  Lab 02/16/24 1055 02/17/24 1106  02/18/24 0853 02/18/24 1025 02/19/24 0257 02/19/24 0258  NA 135 136 139 138 134* 134*  K 3.8 3.2* 3.3* 3.4* 3.2* 3.2*  CL 99 99 100 99 98 99  CO2  --  19* 20* 21* 23 23  GLUCOSE 144* 119* 110* 142* 122* 127*  BUN 84* 90* 88* 88* 86* 83*  CREATININE 10.00* 9.37* 9.82* 9.87* 9.85* 9.83*  CALCIUM   --  7.4* 7.5* 7.3* 6.7* 6.6*  PHOS  --  11.0* 10.6* 10.6* 10.5* 10.4*   CBC Recent Labs  Lab 02/16/24 1055 02/17/24 1106 02/18/24 0853 02/19/24 0257  WBC  --  8.2 7.1 7.0  HGB 8.5* 7.6* 7.8* 7.8*  HCT 25.0* 24.1* 24.6* 24.6*  MCV  --  86.7 86.3 85.1  PLT  --  342 337 332    Medications:     amLODipine   10 mg Oral Daily   carvedilol   12.5 mg Oral BID   hydrALAZINE   25 mg Oral TID   insulin  pump   Subcutaneous Q4H   lamoTRIgine   50 mg Oral BID   metolazone   2.5 mg Oral Daily   pantoprazole   40 mg Oral Daily   potassium chloride   40 mEq Oral Once   potassium chloride   20 mEq Oral Q4H   rosuvastatin   20 mg Oral QHS   sertraline   100 mg Oral Daily    Clevester Dally, MD 02/19/2024, 9:02 AM

## 2024-02-19 NOTE — Discharge Instructions (Signed)
 Advised follow-up with primary care physician in 1 week. Advised to follow-up with nephrologist in [place]. Advised to take torsemide  80 mg every 12 hours and metolazone  2.5 mg every 12 hours.

## 2024-02-21 ENCOUNTER — Other Ambulatory Visit (HOSPITAL_COMMUNITY): Payer: Self-pay

## 2024-02-23 ENCOUNTER — Telehealth: Payer: Self-pay

## 2024-02-23 NOTE — Telephone Encounter (Signed)
 Pt's dad, Briggs Edelen, called reporting two stitches popped around pt's PD catheter. Pt was taken to Urgent Care where skin glue was applied and patched up. Tim reported the port looks good.  Naida Austria, PA advised no further intervention is needed.  Tim reported pt will have dialysis flush tomorrow. Advised to have dialysis center call if they have any problems with the catheter.

## 2024-02-28 ENCOUNTER — Telehealth: Payer: Self-pay

## 2024-02-28 NOTE — Progress Notes (Signed)
 [facility] [place] PROGRESS NOTE  ASSESSMENT / PLAN: Stephen Prince is a 28 y.o. male with leaking of dialysate from right sided trocar site after PD catheter placement.  The patient had to start PD fairly rapidly after placement of the catheter, and I think his trocar site is just not completely healed.  No evidence of infection.  I reapplied Dermabond to the wound to try to seal it and supplied the patient with additional Dermabond sticks to help seal it.  He should continue peritoneal dialysis.  Should he continue to struggle with drainage, I would recommend exploration in the operating room and placement of tunneled dialysis catheter for temporary use while his abdominal wall heals.  SUBJECTIVE: No complaints.  Feels much better after initiating dialysis.  Has lost a significant weight.  Patient has noticed leaking from right sided trocar site which has been bothersome to him.  He has been trying to manage as best he can and has seen urgent care for the same.  OBJECTIVE: BP (!) 157/89 (BP Location: Left Arm, Patient Position: Sitting, Cuff Size: Normal)   Pulse 91   Temp 98.5 F (36.9 C)   Resp 20   Ht 5' 7 (1.702 m)   Wt 193 lb (87.5 kg)   SpO2 93%   BMI 30.23 kg/m   Young man in no distress.  Looks better overall compared to last evaluation Regular rate and rhythm Unlabored breathing Right lateral trocar site appears clean and dry.  The wound is still healing.  I reapplied Dermabond today in the office.     Latest Ref Rng & Units 02/19/2024    2:57 AM 02/18/2024    8:53 AM 02/17/2024   11:06 AM  CBC  WBC 4.0 - 10.5 K/uL 7.0  7.1  8.2   Hemoglobin 13.0 - 17.0 g/dL 7.8  7.8  7.6   Hematocrit 39.0 - 52.0 % 24.6  24.6  24.1   Platelets 150 - 400 K/uL 332  337  342         Latest Ref Rng & Units 02/19/2024    2:58 AM 02/19/2024    2:57 AM 02/18/2024   10:25 AM  CMP  Glucose 70 - 99 mg/dL 872  877  857   BUN 6 - 20 mg/dL 83  86  88   Creatinine 0.61 - 1.24  mg/dL 0.16  0.14  0.12   Sodium 135 - 145 mmol/L 134  134  138   Potassium 3.5 - 5.1 mmol/L 3.2  3.2  3.4   Chloride 98 - 111 mmol/L 99  98  99   CO2 22 - 32 mmol/L 23  23  21    Calcium  8.9 - 10.3 mg/dL 6.6  6.7  7.3     Estimated Creatinine Clearance: 12 mL/min (A) (by C-G formula based on SCr of 9.83 mg/dL (H)).  Debby SAILOR. Magda, MD [facility] Phone Number: [phone] 02/29/2024 9:25 AM

## 2024-02-28 NOTE — Telephone Encounter (Signed)
 Pt's HH nurse Maricela Shoe called to let us  know pt is actively using PD cath and having to change dressing often, about every 1-2 hours. It is clear fluid draining. Per APP, they have been advised to keep a dry dressing on and continue to change it as needed.

## 2024-02-28 NOTE — Telephone Encounter (Signed)
 Pt called [facility] triage line to report the skin glue around his PD cath has dissolved is leaking clear fluid.  Advised patient to use-wet-to-dry dressing on the opening.   Advised pt to put wet gauze over the wound but to keep it off of his intact skin, then cover with dry guaze. Advised pt to call back if he develops any signs of infection - redness, warm, purulent drainage and/or temp. Pt verbally agreed.

## 2024-02-29 ENCOUNTER — Encounter: Payer: Self-pay | Admitting: Vascular Surgery

## 2024-02-29 ENCOUNTER — Ambulatory Visit: Attending: Vascular Surgery | Admitting: Vascular Surgery

## 2024-02-29 ENCOUNTER — Encounter: Payer: Self-pay | Admitting: Neurology

## 2024-02-29 ENCOUNTER — Encounter

## 2024-02-29 VITALS — BP 157/89 | HR 91 | Temp 98.5°F | Resp 20 | Ht 67.0 in | Wt 193.0 lb

## 2024-02-29 DIAGNOSIS — Z992 Dependence on renal dialysis: Secondary | ICD-10-CM

## 2024-02-29 DIAGNOSIS — N186 End stage renal disease: Secondary | ICD-10-CM

## 2024-04-10 ENCOUNTER — Encounter: Payer: Self-pay | Admitting: Neurology

## 2024-04-10 ENCOUNTER — Ambulatory Visit: Admitting: Dermatology

## 2024-04-10 ENCOUNTER — Ambulatory Visit (INDEPENDENT_AMBULATORY_CARE_PROVIDER_SITE_OTHER): Payer: Medicaid Other | Admitting: Neurology

## 2024-04-10 VITALS — BP 110/61 | HR 82 | Ht 67.0 in | Wt 172.0 lb

## 2024-04-10 DIAGNOSIS — R569 Unspecified convulsions: Secondary | ICD-10-CM

## 2024-04-10 MED ORDER — LAMOTRIGINE 25 MG PO TABS: 50.0000 mg | ORAL_TABLET | Freq: Two times a day (BID) | ORAL | 3 refills | Status: DC

## 2024-04-10 NOTE — Patient Instructions (Signed)
 Good to see you. Continue Lamotrigine  25mg : take 2 tablets twice a day. Continue with good glucose control. Follow-up in 6 months, call for any changes.    Seizure Precautions: 1. If medication has been prescribed for you to prevent seizures, take it exactly as directed.  Do not stop taking the medicine without talking to your doctor first, even if you have not had a seizure in a long time.   2. Avoid activities in which a seizure would cause danger to yourself or to others.  Don't operate dangerous machinery, swim alone, or climb in high or dangerous places, such as on ladders, roofs, or girders.  Do not drive unless your doctor says you may.  3. If you have any warning that you may have a seizure, lay down in a safe place where you can't hurt yourself.    4.  No driving for 6 months from last seizure, as per [place]  state law.   Please refer to the following link on the Epile[employer] of America's website for more information: http://www.epilepsyfoundation.org/answerplace/Social/driving/drivingu.cfm   5.  Maintain good sleep hygiene. Avoid alcohol.  6.  Contact your doctor if you have any problems that may be related to the medicine you are taking.  7.  Call 911 and bring the patient back to the ED if:        A.  The seizure lasts longer than 5 minutes.       B.  The patient doesn't awaken shortly after the seizure  C.  The patient has new problems such as difficulty seeing, speaking or moving  D.  The patient was injured during the seizure  E.  The patient has a temperature over 102 F (39C)  F.  The patient vomited and now is having trouble breathing

## 2024-04-10 NOTE — Progress Notes (Signed)
 NEUROLOGY FOLLOW UP OFFICE NOTE  Stephen Prince [id number] 12-Dec-1995  HISTORY OF PRESENT ILLNESS: I had the pleasure of seeing Stephen Prince in follow-up in the neurology clinic on 04/10/2024.  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 He is alone in the office today. Since his last visit, he denies any seizures since 06/2023 on Lamotrigine  50mg  BID without side effects. His glucose levels have been good as well, the lowest would be between 75-8 (previously seizures occurred with glucose levels less than 60). He denies any staring/unresponsive episodes, gaps in time, olfactory/gustatory hallucinations, focal numbness/tingling/weakness, myoclonic jerks. No headaches, dizziness, vision changes, no falls. He gets 6 hours of sleep at best due to recently started PD. He is in the process of seeing Transplant Medicine at [facility]. He is driving.   History on Initial Assessment 06/03/2023: This is a pleasant 29 year old right-handed man with a history of DM1 presenting for evaluation of recurrent seizures. His sister Stephen Prince has witnessed all of them and provides additional information. The first seizure occurred a year ago, he was getting home from work and took insulin . He was working towards getting food when he started feeling his typical hypoglycemic symptoms of blurred vision, diaphoresis, dizziness, and disorientation. He does not recall going down then woke up on his bed with a tongue bite. [name] had called EMS, she heard him cry out and found him jerking and vomiting. He did not go to the hospital that time. The second occurred sometime in 2023, he was eating this time and felt the same hypoglycemic symptoms then woke up in his bed with a tongue bite, entire body was sore and stiff. The most recent episode was on 05/11/23, he got home from work and was eating when he felt the hypoglycemic symptoms. His pump started to spike a little, giving more insulin ,  then he woke up in the hospital. He had bitten his tongue and cheek again. [name] notes that his head was turned to the right side with all 3 seizures, arms flexed with 2 of them. He has had big falls with all of them. He had bounced back pretty easily with the first 2, but with the most recent one, he briefly stopped breathing and was moving around but not opening his eyes or responding until EMS arrived, then he was very combative. No incontinence. With all of the seizures, glucose levels were in the 40s or below.   [name] denies any staring episodes. He denies any olfactory/gustatory hallucinations, deja vu, rising epigastric sensation, focal numbness/tingling/weakness, myoclonic jerks. He forgets a lot. He has tremors when hypoglycemic. No headaches, dizziness, diplopia, dysarthria/dysphagia, neck/back pain, bowel/bladder dysfunction. He gets 4-7 hours of sleep. When his glucose is low, he cannot sleep. He denies any sleep deprivation prior to the seizures, no alcohol. He feels physically fine now but down in the dumps due to driving restrictions. He was born with a nuchal cord, normal early development.  There is no history of febrile convulsions, CNS infections such as meningitis/encephalitis, significant traumatic brain injury, neurosurgical procedures, or family history of seizures.  Diagnostic Data: EEGs: EEG on 05/12/23 normal 24-hour EEG 08/2023 normal, typical events not captured  MRI brain without contrast 06/2023 normal  PAST MEDICAL HISTORY: Past Medical History:  Diagnosis Date   Acute kidney injury superimposed on CKD (HCC) 01/07/2024   Anxiety    CKD (chronic kidney disease) stage 4, GFR 15-29 ml/min (HCC)  Depression    Diabetes mellitus without complication (HCC)     MEDICATIONS: Current Outpatient Medications on File Prior to Visit  Medication Sig Dispense Refill   acetaminophen  (TYLENOL ) 325 MG tablet Take 2 tablets (650 mg total) by mouth every 6 (six) hours as needed for  mild pain (pain score 1-3) (or Fever >/= 101).     amLODipine  (NORVASC ) 10 MG tablet Take 1 tablet (10 mg total) by mouth daily. 30 tablet 0   calcitRIOL  (ROCALTROL ) 0.25 MCG capsule Take 0.25 mcg by mouth daily.     carvedilol  (COREG ) 12.5 MG tablet Take 1 tablet (12.5 mg total) by mouth 2 (two) times daily. 60 tablet 0   Continuous Glucose Sensor (DEXCOM G7 SENSOR) MISC Inject 1 application  into the skin See admin instructions. Every 10 days     ferrous sulfate  325 (65 FE) MG EC tablet Take 1 tablet (325 mg total) by mouth 2 (two) times daily. 60 tablet 0   Glucagon (GVOKE HYPOPEN 2-PACK) 1 MG/0.2ML SOAJ Inject 1 pen  into the skin once as needed (For severe hypoglycemia).     hydrALAZINE  (APRESOLINE ) 25 MG tablet Take 1 tablet (25 mg total) by mouth 3 (three) times daily. 90 tablet 0   insulin  aspart (NOVOLOG ) 100 UNIT/ML injection Inject 60 Units into the skin daily. Insulin  pump     Insulin  Infusion Pump (T:SLIM X2 INS PUMP/CONTROL-IQ) DEVI 1 Device by Does not apply route every 3 (three) days. Change site every 3 days     lamoTRIgine  (LAMICTAL ) 25 MG tablet Take 2 tablets (50 mg total) by mouth 2 (two) times daily. 120 tablet 0   metolazone  (ZAROXOLYN ) 2.5 MG tablet Take by mouth.     ondansetron  (ZOFRAN ) 4 MG tablet Take 1 tablet (4 mg total) by mouth every 6 (six) hours. 12 tablet 0   oxyCODONE -acetaminophen  (PERCOCET) 5-325 MG tablet Take 1 tablet by mouth every 6 (six) hours as needed for severe pain (pain score 7-10). 20 tablet 0   potassium chloride  (KLOR-CON ) 20 MEQ packet Dissolve 2 PACKETS (40 mEq total) in 4 ounces of water and drink once daily. 60 packet 0   rosuvastatin  (CRESTOR ) 20 MG tablet Take 20 mg by mouth at bedtime.     senna-docusate (SENOKOT-S) 8.6-50 MG tablet Take 1 tablet by mouth at bedtime as needed for moderate constipation. 30 tablet 0   sertraline  (ZOLOFT ) 100 MG tablet Take 100 mg by mouth daily.     sodium bicarbonate  650 MG tablet Take 650 mg by mouth.      torsemide  (DEMADEX ) 20 MG tablet Take 4 tablets (80 mg total) by mouth 2 (two) times daily. 240 tablet 0   Vitamin D, Ergocalciferol, (DRISDOL) 1.25 MG (50000 UNIT) CAPS capsule Take 50,000 Units by mouth once a week. Tuesday     No current facility-administered medications on file prior to visit.    ALLERGIES: No Known Allergies  FAMILY HISTORY: Family History  Problem Relation Age of Onset   Diabetes Mother    Hypertension Father     SOCIAL HISTORY: Social History   Socioeconomic History   Marital status: Single    Spouse name: Not on file   Number of children: Not on file   Years of education: Not on file   Highest education level: Not on file  Occupational History   Not on file  Tobacco Use   Smoking status: Never   Smokeless tobacco: Never  Vaping Use   Vaping status: Never Used  Substance  and Sexual Activity   Alcohol use: Never   Drug use: Never   Sexual activity: Not on file  Other Topics Concern   Not on file  Social History Narrative   Are you right handed or left handed? right   Are you currently employed ?  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 No    Who lives with you? Family    What type of home do you live in: 1 story or 2 story?  2 story stays on 1st level        Social Drivers of Health   Financial Resource Strain: Not on file  Food Insecurity: No Food Insecurity (02/17/2024)   Hunger Vital Sign    Worried About Running Out of Food in the Last Year: Never true    Ran Out of Food in the Last Year: Never true  Transportation Needs: No Transportation Needs (02/17/2024)   PRAPARE - Administrator, Civil Service (Medical): No    Lack of Transportation (Non-Medical): No  Physical Activity: Not on file  Stress: Not on file  Social Connections: Not on file  Intimate Partner Violence: Not At Risk (02/17/2024)   Humiliation, Afraid, Rape, and Kick questionnaire    Fear of Current  or Ex-Partner: No    Emotionally Abused: No    Physically Abused: No    Sexually Abused: No     PHYSICAL EXAM: Vitals:   04/10/24 1340  BP: 110/61  Pulse: 82  SpO2: 97%   General: No acute distress Head:  Normocephalic/atraumatic Skin/Extremities: No rash, no edema Neurological Exam: alert and awake. No aphasia or dysarthria. Fund of knowledge is appropriate.  Attention and concentration are normal.   Cranial nerves: Pupils equal, round. Extraocular movements intact with no nystagmus. Visual fields full.  No facial asymmetry.  Motor: Bulk and tone normal, muscle strength 5/5 throughout with no pronator drift.   Finger to nose testing intact.  Gait narrow-based and steady, able to tandem walk adequately.  Romberg negative.   IMPRESSION: This is a pleasant 28 yo RH man with a history of DM1, ESRD on PD, with recurrent convulsions that have all occurred in the setting of hypoglycemia. His sister has witnessed them and reports right head deviation with all the seizures, raising concern for focal seizures (which can occur with hypoglycemia). MRI brain without contrast and 24-hour EEG normal. He was started on Lamotrigine  as a therapeutic trial due to difficulty with labile glucose levels, he has been seizure-free since 06/2023 on Lamotrigine  50mg  BID. Glucose levels are also better controlled now. He is aware of [place] driving laws to stop driving after a seizure until 6 months seizure-free. Follow-up in 6 months, call for any changes.    Thank you for allowing me to participate in his care.  Please do not hesitate to call for any questions or concerns.    Darice Shivers, M.D.   CC: Dr. Arloa

## 2024-05-12 ENCOUNTER — Ambulatory Visit: Admitting: Podiatry

## 2024-05-17 ENCOUNTER — Ambulatory Visit: Admitting: Dermatology

## 2024-06-08 ENCOUNTER — Ambulatory Visit: Admitting: Podiatry

## 2024-10-11 ENCOUNTER — Ambulatory Visit (INDEPENDENT_AMBULATORY_CARE_PROVIDER_SITE_OTHER): Admitting: Neurology

## 2024-10-11 ENCOUNTER — Encounter: Payer: Self-pay | Admitting: Neurology

## 2024-10-11 VITALS — BP 179/117 | HR 100 | Ht 67.0 in | Wt 172.4 lb

## 2024-10-11 DIAGNOSIS — R569 Unspecified convulsions: Secondary | ICD-10-CM | POA: Diagnosis not present

## 2024-10-11 MED ORDER — LAMOTRIGINE 25 MG PO TABS: 50.0000 mg | ORAL_TABLET | Freq: Two times a day (BID) | ORAL | 3 refills | Status: AC

## 2024-10-11 NOTE — Progress Notes (Signed)
 "  NEUROLOGY FOLLOW UP OFFICE NOTE  [name] [id number] 1996-04-01  Discussed the use of AI scribe software for clinical note transcription with the patient, who gave verbal consent to proceed.  History of Present Illness I had the pleasure of seeing Stephen Prince in follow-up in the neurology clinic on 10/11/2024.  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 He is alone in the office today. Records and images were personally reviewed where available.  Since his last visit, he continues to do well from a seizure standpoint, seizure-free since 06/2023 on Lamotrigine  50mg  BID (25mg  2 tabs BID) without side effects. Glucose levels have been better as well (he would previously have seizures with glucose levels less than 60), he reports lowest has been 65 recently. Last HbA1c was 7. He is on dialysis and in the process of pre-transplant evaluation. He denies any staring/unresponsive episodes, gaps in time, olfactory/gustatory hallucinations, focal numbness/tingling/weakness, myoclonic jerks. No significant headaches, dizziness, vision changes, no falls. Sleep is limited to approximately five hours per night, which he attributes to overnight dialysis. He manages mild hypoglycemia independently and has not had episodes during sleep.  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 Blood pressure has been elevated recently, which he attributes to renal disease. He took his antihypertensive medication one hour prior to the visit.    History on Initial Assessment 06/03/2023: This is a pleasant 29 year old right-handed man with a history of DM1 presenting for evaluation of recurrent seizures. His sister [name] has witnessed all of them and provides additional information. The first seizure occurred a year ago, he was getting home from work and took insulin . He was working towards getting  food when he started feeling his typical hypoglycemic symptoms of blurred vision, diaphoresis, dizziness, and disorientation. He does not recall going down then woke up on his bed with a tongue bite. [name] had called EMS, she heard him cry out and found him jerking and vomiting. He did not go to the hospital that time. The second occurred sometime in 2023, he was eating this time and felt the same hypoglycemic symptoms then woke up in his bed with a tongue bite, entire body was sore and stiff. The most recent episode was on 05/11/23, he got home from work and was eating when he felt the hypoglycemic symptoms. His pump started to spike a little, giving more insulin , then he woke up in the hospital. He had bitten his tongue and cheek again. [name] notes that his head was turned to the right side with all 3 seizures, arms flexed with 2 of them. He has had big falls with all of them. He had bounced back pretty easily with the first 2, but with the most recent one, he briefly stopped breathing and was moving around but not opening his eyes or responding until EMS arrived, then he was very combative. No incontinence. With all of the seizures, glucose levels were in the 40s or below.   [name] denies any staring episodes. He denies any olfactory/gustatory hallucinations, deja vu, rising epigastric sensation, focal numbness/tingling/weakness, myoclonic jerks. He forgets a lot. He has tremors when hypoglycemic. No headaches, dizziness, diplopia, dysarthria/dysphagia, neck/back pain, bowel/bladder dysfunction. He gets 4-7 hours of sleep. When his glucose is low, he cannot sleep. He denies any sleep deprivation prior to the seizures, no alcohol. He feels physically fine now but down in the  dumps due to driving restrictions. He was born with a nuchal cord, normal early development.  There is no history of febrile convulsions, CNS infections such as meningitis/encephalitis, significant traumatic brain injury, neurosurgical procedures,  or family history of seizures.  Diagnostic Data: EEGs: EEG on 05/12/23 normal 24-hour EEG 08/2023 normal, typical events not captured  MRI brain without contrast 06/2023 normal  PAST MEDICAL HISTORY: Past Medical History:  Diagnosis Date   Acute kidney injury superimposed on CKD 01/07/2024   Anxiety    CKD (chronic kidney disease) stage 4, GFR 15-29 ml/min (HCC)    Depression    Diabetes mellitus without complication (HCC)     MEDICATIONS: Medications Ordered Prior to Encounter[1]  ALLERGIES: Allergies[2]  FAMILY HISTORY: Family History  Problem Relation Age of Onset   Diabetes Mother    Hypertension Father     SOCIAL HISTORY: Social History   Socioeconomic History   Marital status: Single    Spouse name: Not on file   Number of children: Not on file   Years of education: Not on file   Highest education level: Not on file  Occupational History   Not on file  Tobacco Use   Smoking status: Never   Smokeless tobacco: Never  Vaping Use   Vaping status: Never Used  Substance and Sexual Activity   Alcohol use: Never   Drug use: Never   Sexual activity: Not on file  Other Topics Concern   Not on file  Social History Narrative   Are you right handed or left handed? right   Are you currently employed ?  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 No    Who lives with you? Family    What type of home do you live in: 1 story or 2 story?  2 story stays on 1st level        Social Drivers of Health   Tobacco Use: Low Risk (10/11/2024)   Patient History    Smoking Tobacco Use: Never    Smokeless Tobacco Use: Never    Passive Exposure: Not on file  Financial Resource Strain: Not on file  Food Insecurity: No Food Insecurity (02/17/2024)   Hunger Vital Sign    Worried About Running Out of Food in the Last Year: Never true    Ran Out of Food in the Last Year: Never true  Transportation Needs: No  Transportation Needs (02/17/2024)   PRAPARE - Administrator, Civil Service (Medical): No    Lack of Transportation (Non-Medical): No  Physical Activity: Not on file  Stress: Not on file  Social Connections: Not on file  Intimate Partner Violence: Not At Risk (02/17/2024)   Humiliation, Afraid, Rape, and Kick questionnaire    Fear of Current or Ex-Partner: No    Emotionally Abused: No    Physically Abused: No    Sexually Abused: No  Depression (PHQ2-9): Not on file  Alcohol Screen: Not on file  Housing: Low Risk (02/17/2024)   Housing Stability Vital Sign    Unable to Pay for Housing in the Last Year: No    Number of Times Moved in the Last Year: 0    Homeless in the Last Year: No  Utilities: Not At Risk (02/17/2024)   AHC Utilities    Threatened with loss of utilities: No  Health Literacy: Not on file     PHYSICAL EXAM: Vitals:  10/11/24 1103 10/11/24 1107  BP: (!) 163/101 (!) 179/117  Pulse: 100   SpO2: 98%    General: No acute distress Head:  Normocephalic/atraumatic Skin/Extremities: No rash, no edema Neurological Exam: alert and awake. No aphasia or dysarthria. Fund of knowledge is appropriate. Attention and concentration are normal.   Cranial nerves: Pupils equal, round. Extraocular movements intact with no nystagmus. Visual fields full.  No facial asymmetry.  Motor: Bulk and tone normal, muscle strength 5/5 throughout with no pronator drift.   Finger to nose testing intact.  Gait narrow-based and steady, able to tandem walk adequately.  Romberg negative.   IMPRESSION: This is a pleasant 29 yo RH man with a history of DM1, ESRD on PD, with recurrent convulsions that have all occurred in the setting of hypoglycemia. His sister has witnessed them and reports right head deviation with all the seizures, raising concern for focal seizures (which can occur with hypoglycemia). MRI brain without contrast and 24-hour EEG normal. He was started on Lamotrigine  as a therapeutic  trial due to difficulty with labile glucose levels, and has been seizure-free since 06/2023 on Lamotrigine  50mg  BID, refills sent. Glucose levels are also better controlled. BP today significantly elevated, continue to monitor and follow-up with PCP/[facility]. He is aware of [place] driving laws to stop driving after a seizure until 6 months seizure-free. Follow-up in 8 months, call for any changes.    Thank you for allowing me to participate in his care.  Please do not hesitate to call for any questions or concerns.    Darice Shivers, M.D.   CC: Dr. Arloa     [1]  Current Outpatient Medications on File Prior to Visit  Medication Sig Dispense Refill   acetaminophen  (TYLENOL ) 325 MG tablet Take 2 tablets (650 mg total) by mouth every 6 (six) hours as needed for mild pain (pain score 1-3) (or Fever >/= 101).     carvedilol  (COREG ) 3.125 MG tablet Take 3.125 mg by mouth 2 (two) times daily.     Continuous Glucose Sensor (DEXCOM G7 SENSOR) MISC Inject 1 application  into the skin See admin instructions. Every 10 days     ferrous sulfate  325 (65 FE) MG EC tablet Take 1 tablet (325 mg total) by mouth 2 (two) times daily. 60 tablet 0   Glucagon (GVOKE HYPOPEN 2-PACK) 1 MG/0.2ML SOAJ Inject 1 pen  into the skin once as needed (For severe hypoglycemia).     insulin  aspart (NOVOLOG ) 100 UNIT/ML injection Inject 60 Units into the skin daily. Insulin  pump     Insulin  Infusion Pump (T:SLIM X2 INS PUMP/CONTROL-IQ) DEVI 1 Device by Does not apply route every 3 (three) days. Change site every 3 days     lamoTRIgine  (LAMICTAL ) 25 MG tablet Take 2 tablets (50 mg total) by mouth 2 (two) times daily. 360 tablet 3   metolazone  (ZAROXOLYN ) 2.5 MG tablet Take by mouth.     ondansetron  (ZOFRAN ) 4 MG tablet Take 1 tablet (4 mg total) by mouth every 6 (six) hours. 12 tablet 0   oxyCODONE -acetaminophen  (PERCOCET) 5-325 MG tablet Take 1 tablet by mouth every 6 (six) hours as needed for severe pain (pain score 7-10). 20 tablet  0   potassium chloride  (KLOR-CON ) 20 MEQ packet Dissolve 2 PACKETS (40 mEq total) in 4 ounces of water and drink once daily. 60 packet 0   rosuvastatin  (CRESTOR ) 20 MG tablet Take 20 mg by mouth at bedtime.     senna-docusate (SENOKOT-S) 8.6-50 MG tablet Take 1  tablet by mouth at bedtime as needed for moderate constipation. 30 tablet 0   sertraline  (ZOLOFT ) 100 MG tablet Take 100 mg by mouth daily.     sodium bicarbonate  650 MG tablet Take 650 mg by mouth.     Vitamin D, Ergocalciferol, (DRISDOL) 1.25 MG (50000 UNIT) CAPS capsule Take 50,000 Units by mouth once a week. Tuesday     XPHOZAH 30 MG TABS Take 30 mg by mouth 2 (two) times daily.     amLODipine  (NORVASC ) 10 MG tablet Take 1 tablet (10 mg total) by mouth daily. (Patient not taking: Reported on 10/11/2024) 30 tablet 0   calcitRIOL  (ROCALTROL ) 0.25 MCG capsule Take 0.25 mcg by mouth daily. (Patient not taking: Reported on 10/11/2024)     carvedilol  (COREG ) 12.5 MG tablet Take 1 tablet (12.5 mg total) by mouth 2 (two) times daily. 60 tablet 0   hydrALAZINE  (APRESOLINE ) 25 MG tablet Take 1 tablet (25 mg total) by mouth 3 (three) times daily. (Patient not taking: Reported on 10/11/2024) 90 tablet 0   torsemide  (DEMADEX ) 20 MG tablet Take 4 tablets (80 mg total) by mouth 2 (two) times daily. (Patient not taking: Reported on 10/11/2024) 240 tablet 0   No current facility-administered medications on file prior to visit.  [2] No Known Allergies  "

## 2024-10-11 NOTE — Patient Instructions (Signed)
 It's good to see you. Continue Lamotrigine  25mg : take 2 tablets twice a day. Follow-up in 8 months, call for any changes.    Seizure Precautions: 1. If medication has been prescribed for you to prevent seizures, take it exactly as directed.  Do not stop taking the medicine without talking to your doctor first, even if you have not had a seizure in a long time.   2. Avoid activities in which a seizure would cause danger to yourself or to others.  Don't operate dangerous machinery, swim alone, or climb in high or dangerous places, such as on ladders, roofs, or girders.  Do not drive unless your doctor says you may.  3. If you have any warning that you may have a seizure, lay down in a safe place where you can't hurt yourself.    4.  No driving for 6 months from last seizure, as per [place]  state law.   Please refer to the following link on the [employer]'s website for more information: http://www.epilepsyfoundation.org/answerplace/Social/driving/drivingu.cfm   5.  Maintain good sleep hygiene. Avoid alcohol.  6.  Contact your doctor if you have any problems that may be related to the medicine you are taking.  7.  Call 911 and bring the patient back to the ED if:        A.  The seizure lasts longer than 5 minutes.       B.  The patient doesn't awaken shortly after the seizure  C.  The patient has new problems such as difficulty seeing, speaking or moving  D.  The patient was injured during the seizure  E.  The patient has a temperature over 102 F (39C)  F.  The patient vomited and now is having trouble breathing

## 2024-10-17 ENCOUNTER — Ambulatory Visit: Payer: Self-pay | Admitting: Dermatology

## 2024-10-17 ENCOUNTER — Encounter: Payer: Self-pay | Admitting: Dermatology

## 2024-10-17 DIAGNOSIS — L700 Acne vulgaris: Secondary | ICD-10-CM

## 2024-10-17 DIAGNOSIS — L810 Postinflammatory hyperpigmentation: Secondary | ICD-10-CM

## 2024-10-17 MED ORDER — CLINDAMYCIN PHOSPHATE 1 % EX SWAB: 1.0000 | Freq: Every morning | CUTANEOUS | 5 refills | Status: AC

## 2024-10-17 MED ORDER — TRETINOIN 0.025 % EX CREA: TOPICAL_CREAM | CUTANEOUS | 2 refills | Status: AC

## 2024-10-17 NOTE — Progress Notes (Signed)
" ° °  Follow-Up Visit  Patient (and/or pt guardian) consented to the use of AI-assisted tools for note generation.    Subjective  Stephen Prince is a 29 y.o. male who presents for the following: Acne  Patient was last evaluated on 12/09/23. At this visit patient was prescribed clindamycin  swabs to us  in am, and tretinoin  0.025% to use every other night.  Patient was advised to avoid exfoliators with salicylic acid Discussed Doxycycline  as an option if topicals are not working well enough for flares Patient reports sxs are unchanged.  Patient denies medication changes. Patient reports that he has been dealing with chronic kidney issues and is on dialysis and has not been using the tretinoin  or clindamycin  swabs. Patient reports he last used these topicals in May. Patient reports he has flare ups once a month Patient reports he is using Cetaphil moisturizer and sunscreen  The following portions of the chart were reviewed this encounter and updated as appropriate: medications, allergies, medical history  Review of Systems:  No other skin or systemic complaints except as noted in HPI or Assessment and Plan.  Objective  Well appearing patient in no apparent distress; mood and affect are within normal limits.  A focused examination was performed of the following areas: Face  Relevant exam findings are noted in the Assessment and Plan.        Assessment & Plan    Acne vulgaris Involving the face and back, previously managed with clindamycin  swabs and tretinoin , resulting in clear skin. Current exacerbation on the back due to phosphorus buildup causing itchiness and scarring. Avoid oral medications due to kidney concerns.  - Apply clindamycin  swabs to the face and back every morning after washing. - Use a moisturizer on top of the clindamycin  swabs to prevent dryness. - Restart tretinoin  at 0.025% concentration, applying two nights a week initially, increasing frequency as  tolerated. - Use a pea-sized amount of tretinoin  for the face, applying to each cheek, forehead, nose, and chin. - Mix a dime-sized amount of tretinoin  with a quarter-sized amount of moisturizer for application on the back. - Monitor for side effects such as stinging, burning, dryness, or irritation, and adjust frequency accordingly. - Expect gradual improvement over three to four months with continued use. - Will increase tretinoin  strength after a year if needed.  Postinflammatory hyperpigmentation Arms due to recent itching. Tretinoin  expected to accelerate pigment turnover and fading.  - Apply tretinoin  to affected areas to promote skin cell turnover and pigment shedding. - Use sunscreen when the weather warms to prevent pigment settling. ACNE VULGARIS   This Visit - clindamycin  (CLEOCIN  T) 1 % SWAB - Apply 1 Application topically in the morning. - tretinoin  (RETIN-A ) 0.025 % cream - use pea size (1gm) amount to face 2 nights a week. Increase to 3 nights in March and 5 nights by August.  Return in about 1 year (around 10/17/2025) for ACNE.    Documentation: I have reviewed the above documentation for accuracy and completeness, and I agree with the above.  Delon Lenis, DO   "

## 2024-10-20 ENCOUNTER — Encounter (HOSPITAL_COMMUNITY): Payer: Self-pay | Admitting: Vascular Surgery

## 2025-06-11 ENCOUNTER — Ambulatory Visit: Payer: Self-pay | Admitting: Neurology
